# Patient Record
Sex: Male | Born: 1980 | Race: Black or African American | Hispanic: No | Marital: Single | Smoking: Former smoker
Health system: Southern US, Community
[De-identification: ages and names within clinical notes are randomized; demographics above are authoritative.]

## PROBLEM LIST (undated history)

## (undated) DIAGNOSIS — G4730 Sleep apnea, unspecified: Secondary | ICD-10-CM

## (undated) DIAGNOSIS — E669 Obesity, unspecified: Secondary | ICD-10-CM

## (undated) DIAGNOSIS — K219 Gastro-esophageal reflux disease without esophagitis: Secondary | ICD-10-CM

## (undated) DIAGNOSIS — E66811 Obesity, class 1: Secondary | ICD-10-CM

## (undated) HISTORY — PX: APPENDECTOMY: SHX54

---

## 2015-05-26 ENCOUNTER — Encounter: Payer: Self-pay | Admitting: *Deleted

## 2015-05-26 ENCOUNTER — Emergency Department
Admission: EM | Admit: 2015-05-26 | Discharge: 2015-05-26 | Disposition: A | Discharge status: Home / Self Care | Payer: Self-pay | Attending: Emergency Medicine | Admitting: Emergency Medicine

## 2015-05-26 DIAGNOSIS — Y9289 Other specified places as the place of occurrence of the external cause: Secondary | ICD-10-CM | POA: Insufficient documentation

## 2015-05-26 DIAGNOSIS — Y998 Other external cause status: Secondary | ICD-10-CM | POA: Insufficient documentation

## 2015-05-26 DIAGNOSIS — T161XXA Foreign body in right ear, initial encounter: Secondary | ICD-10-CM | POA: Insufficient documentation

## 2015-05-26 DIAGNOSIS — X58XXXA Exposure to other specified factors, initial encounter: Secondary | ICD-10-CM | POA: Insufficient documentation

## 2015-05-26 DIAGNOSIS — Z87891 Personal history of nicotine dependence: Secondary | ICD-10-CM | POA: Insufficient documentation

## 2015-05-26 DIAGNOSIS — Y9389 Activity, other specified: Secondary | ICD-10-CM | POA: Insufficient documentation

## 2015-05-26 NOTE — ED Provider Notes (Signed)
[facility] Provider Note  ____________________________________________  Time seen: 5:40 AM  I have reviewed the triage vital signs and the nursing notes.   HISTORY  Chief Complaint Foreign Body in Ear     HPI Vence Krantz is a 34 y.o. male presents with sensation of a bug in his right ear crawling around.    Past medical history Appendicitis There are no active problems to display for this patient.   Past Surgical History  Procedure Laterality Date  . Appendectomy      No current outpatient prescriptions on file.  Allergies Tylenol sinus congestion-pain  History reviewed. No pertinent family history.  Social History Social History  Substance Use Topics  . Smoking status: Former Games developer  . Smokeless tobacco: Never Used  . Alcohol Use: Yes     Comment: occasionally    Review of Systems  Constitutional: Negative for fever. Eyes: Negative for visual changes. ENT: Negative for sore throat. Foreign body in right ear " bug" Cardiovascular: Negative for chest pain. Respiratory: Negative for shortness of breath. Gastrointestinal: Negative for abdominal pain, vomiting and diarrhea. Genitourinary: Negative for dysuria. Musculoskeletal: Negative for back pain. Skin: Negative for rash. Neurological: Negative for headaches, focal weakness or numbness.   10-point ROS otherwise negative.  ____________________________________________   PHYSICAL EXAM:  VITAL SIGNS: ED Triage Vitals  Enc Vitals Group     BP 05/26/15 0527 137/97 mmHg     Pulse Rate 05/26/15 0527 80     Resp 05/26/15 0527 20     Temp 05/26/15 0527 98.1 F (36.7 C)     Temp Source 05/26/15 0527 Oral     SpO2 05/26/15 0527 93 %     Weight 05/26/15 0527 335 lb (151.955 kg)     Height 05/26/15 0527  (1.626 m)     Head Cir --      Peak Flow --      Pain Score --      Pain Loc --      Pain Edu? --      Excl. in GC? --       Constitutional: Alert and oriented. Well appearing and in no distress. Eyes: Conjunctivae are normal. PERRL. Normal extraocular movements. ENT   Head: Normocephalic and atraumatic.   Nose: No congestion/rhinnorhea.   Mouth/Throat: Mucous membranes are moist.   Neck: No stridor. Ears: Insect crawled out of the patient'right ear with direct visualization. TM visualized and intact.     INITIAL IMPRESSION / ASSESSMENT AND PLAN / ED COURSE  Pertinent labs & imaging results that were available during my care of the patient were reviewed by me and considered in my medical decision making (see chart for details).  In second patient's right ear removed without instrumentation.  ____________________________________________   FINAL CLINICAL IMPRESSION(S) / ED DIAGNOSES  Final diagnoses:  Foreign body in right ear, initial encounter      Darci Current, MD 05/26/15 [phone]

## 2015-05-26 NOTE — Discharge Instructions (Signed)
Ear Foreign Body °An ear foreign body is an object that is stuck in the ear. It is common for young children to put objects into the ear canal. These may include pebbles, beads, beans, and any other small objects which will fit. In adults, objects such as cotton swabs may become lodged in the ear canal. In all ages, the most common foreign bodies are insects that enter the ear canal.  °SYMPTOMS  °Foreign bodies may cause pain, buzzing or roaring sounds, hearing loss, and ear drainage.  °HOME CARE INSTRUCTIONS  °· Keep all follow-up appointments with your caregiver as told. °· Keep small objects out of reach of young children. Tell them not to put anything in their ears. °SEEK IMMEDIATE MEDICAL CARE IF:  °· You have bleeding from the ear. °· You have increased pain or swelling of the ear. °· You have reduced hearing. °· You have discharge coming from the ear. °· You have a fever. °· You have a headache. °MAKE SURE YOU:  °· Understand these instructions. °· Will watch your condition. °· Will get help right away if you are not doing well or get worse. °Document Released: 09/12/2000 Document Revised: 12/08/2011 Document Reviewed: 05/03/2008 °[employer]® Patient Information ©2015 [employer]. This information is not intended to replace advice given to you by your health care provider. Make sure you discuss any questions you have with your health care provider. ° °

## 2015-05-26 NOTE — ED Notes (Signed)
Pt states he felt something crawling on his head and he swiped it off with his hand then it went into his ear. Pt c/o insect bites on body, also.

## 2015-11-21 ENCOUNTER — Emergency Department (HOSPITAL_COMMUNITY)
Admission: EM | Admit: 2015-11-21 | Discharge: 2015-11-22 | Disposition: A | Discharge status: Home / Self Care | Payer: BLUE CROSS/BLUE SHIELD | Attending: Emergency Medicine | Admitting: Emergency Medicine

## 2015-11-21 ENCOUNTER — Encounter (HOSPITAL_COMMUNITY): Payer: Self-pay

## 2015-11-21 DIAGNOSIS — R51 Headache: Secondary | ICD-10-CM | POA: Diagnosis not present

## 2015-11-21 DIAGNOSIS — R300 Dysuria: Secondary | ICD-10-CM | POA: Insufficient documentation

## 2015-11-21 DIAGNOSIS — Z87891 Personal history of nicotine dependence: Secondary | ICD-10-CM | POA: Diagnosis not present

## 2015-11-21 DIAGNOSIS — R197 Diarrhea, unspecified: Secondary | ICD-10-CM | POA: Diagnosis present

## 2015-11-21 DIAGNOSIS — R112 Nausea with vomiting, unspecified: Secondary | ICD-10-CM | POA: Diagnosis not present

## 2015-11-21 DIAGNOSIS — R350 Frequency of micturition: Secondary | ICD-10-CM | POA: Diagnosis not present

## 2015-11-21 DIAGNOSIS — E663 Overweight: Secondary | ICD-10-CM | POA: Diagnosis not present

## 2015-11-21 DIAGNOSIS — R519 Headache, unspecified: Secondary | ICD-10-CM

## 2015-11-21 LAB — CBC
HCT: 47.7 % (ref 39.0–52.0)
HEMOGLOBIN: 15.4 g/dL (ref 13.0–17.0)
MCH: 26.4 pg (ref 26.0–34.0)
MCHC: 32.3 g/dL (ref 30.0–36.0)
MCV: 81.7 fL (ref 78.0–100.0)
Platelets: 171 10*3/uL (ref 150–400)
RBC: 5.84 MIL/uL — ABNORMAL HIGH (ref 4.22–5.81)
RDW: 13.9 % (ref 11.5–15.5)
WBC: 3.6 10*3/uL — AB (ref 4.0–10.5)

## 2015-11-21 LAB — I-STAT CHEM 8, ED
BUN: 8 mg/dL (ref 6–20)
CREATININE: 0.9 mg/dL (ref 0.61–1.24)
Calcium, Ion: 0.99 mmol/L — ABNORMAL LOW (ref 1.12–1.23)
Chloride: 102 mmol/L (ref 101–111)
Glucose, Bld: 102 mg/dL — ABNORMAL HIGH (ref 65–99)
HEMATOCRIT: 48 % (ref 39.0–52.0)
HEMOGLOBIN: 16.3 g/dL (ref 13.0–17.0)
Potassium: 3.9 mmol/L (ref 3.5–5.1)
Sodium: 137 mmol/L (ref 135–145)
TCO2: 21 mmol/L (ref 0–100)

## 2015-11-21 MED ORDER — METOCLOPRAMIDE HCL 5 MG/ML IJ SOLN
10.0000 mg | Freq: Once | INTRAMUSCULAR | Status: AC
  Administered 2015-11-21: 10 mg via INTRAVENOUS
  Filled 2015-11-21: qty 2

## 2015-11-21 MED ORDER — SODIUM CHLORIDE 0.9 % IV BOLUS (SEPSIS)
1000.0000 mL | Freq: Once | INTRAVENOUS | Status: AC
  Administered 2015-11-21: 1000 mL via INTRAVENOUS

## 2015-11-21 MED ORDER — DIPHENHYDRAMINE HCL 50 MG/ML IJ SOLN
12.5000 mg | Freq: Once | INTRAMUSCULAR | Status: AC
  Administered 2015-11-21: 12.5 mg via INTRAVENOUS
  Filled 2015-11-21: qty 1

## 2015-11-21 NOTE — ED Notes (Signed)
CIWA done on wrong patient, unable to edit in chart

## 2015-11-21 NOTE — ED Provider Notes (Signed)
CSN: [id number]     Arrival date & time 11/21/15  1927 History   First MD Initiated Contact with Patient 11/21/15 2249     Chief Complaint  Patient presents with  . Diarrhea  . Headache    HPI   Steve Hurst is a 35 y.o. male with a PMH of appendectomy who presents to the ED with headache, nausea, vomiting, and diarrhea. He states his symptoms started this morning. He reports constant headache, and notes he has a history of headaches and that his symptoms feel similar, though have persisted for longer period of time. He reports nausea and one episode of emesis today prior to arrival. He also notes diarrhea as well as mild dysuria and frequency. He reports subjective fever and chills. He denies vision changes, dizziness, lightheadedness, abdominal pain, hematemesis, hematochezia, melena.    History reviewed. No pertinent past medical history. Past Surgical History  Procedure Laterality Date  . Appendectomy     History reviewed. No pertinent family history. Social History  Substance Use Topics  . Smoking status: Former Games developer  . Smokeless tobacco: Never Used  . Alcohol Use: Yes     Comment: occasionally      Review of Systems  Constitutional: Positive for fever and chills.  Eyes: Negative for visual disturbance.  Gastrointestinal: Positive for nausea, vomiting and diarrhea. Negative for abdominal pain and constipation.  Genitourinary: Positive for dysuria and frequency. Negative for urgency and flank pain.  Neurological: Positive for headaches. Negative for dizziness, syncope, weakness, light-headedness and numbness.  All other systems reviewed and are negative.     Allergies  Tylenol sinus congestion-pain  Home Medications   Prior to Admission medications   Medication Sig Start Date End Date Taking? Authorizing Provider  ibuprofen (ADVIL,MOTRIN) 200 MG tablet Take 200 mg by mouth every 6 (six) hours as needed for moderate pain.   Yes Historical Provider, MD   cephALEXin (KEFLEX) 500 MG capsule Take 1 capsule (500 mg total) by mouth 4 (four) times daily. 11/22/15   Mady Gemma, PA-C  ondansetron (ZOFRAN ODT) 4 MG disintegrating tablet Take 1 tablet (4 mg total) by mouth every 8 (eight) hours as needed for nausea. 11/22/15   Mady Gemma, PA-C    BP 109/51 mmHg  Pulse 87  Temp(Src) 98.8 F (37.1 C) (Oral)  Resp 16  SpO2 97% Physical Exam  Constitutional: He is oriented to person, place, and time. No distress.  Overweight male in no acute distress.  HENT:  Head: Normocephalic and atraumatic.  Right Ear: External ear normal.  Left Ear: External ear normal.  Nose: Nose normal.  Mouth/Throat: Uvula is midline, oropharynx is clear and moist and mucous membranes are normal.  Eyes: Conjunctivae, EOM and lids are normal. Pupils are equal, round, and reactive to light. Right eye exhibits no discharge. Left eye exhibits no discharge. No scleral icterus.  Neck: Normal range of motion. Neck supple.  Cardiovascular: Normal rate, regular rhythm, normal heart sounds, intact distal pulses and normal pulses.   Pulmonary/Chest: Effort normal and breath sounds normal. No respiratory distress. He has no wheezes. He has no rales.  Abdominal: Soft. Normal appearance and bowel sounds are normal. He exhibits no distension and no mass. There is no tenderness. There is no rigidity, no rebound and no guarding.  No CVA tenderness.  Musculoskeletal: Normal range of motion. He exhibits no edema or tenderness.  Neurological: He is alert and oriented to person, place, and time. He has normal strength. No cranial nerve  deficit or sensory deficit.  Skin: Skin is warm, dry and intact. No rash noted. He is not diaphoretic. No erythema. No pallor.  Psychiatric: He has a normal mood and affect. His speech is normal and behavior is normal.  Nursing note and vitals reviewed.   ED Course  Procedures (including critical care time)  Labs Review Labs Reviewed   CBC - Abnormal; Notable for the following:    WBC 3.6 (*)    RBC 5.84 (*)    All other components within normal limits  COMPREHENSIVE METABOLIC PANEL - Abnormal; Notable for the following:    Glucose, Bld 100 (*)    Calcium 8.1 (*)    Total Protein 6.3 (*)    Albumin 3.3 (*)    Total Bilirubin 2.0 (*)    All other components within normal limits  URINALYSIS, ROUTINE W REFLEX MICROSCOPIC (NOT AT [facility]) - Abnormal; Notable for the following:    Color, Urine AMBER (*)    Specific Gravity, Urine 1.035 (*)    Bilirubin Urine SMALL (*)    Leukocytes, UA SMALL (*)    All other components within normal limits  URINE MICROSCOPIC-ADD ON - Abnormal; Notable for the following:    Squamous Epithelial / LPF 0-5 (*)    Bacteria, UA FEW (*)    All other components within normal limits  I-STAT CHEM 8, ED - Abnormal; Notable for the following:    Glucose, Bld 102 (*)    Calcium, Ion 0.99 (*)    All other components within normal limits  URINE CULTURE  LIPASE, BLOOD  I-STAT CG4 LACTIC ACID, ED    Imaging Review No results found.   I have personally reviewed and evaluated these lab results as part of my medical decision-making.   EKG Interpretation None      MDM   Final diagnoses:  Headache, unspecified headache type  Nausea, vomiting, and diarrhea  Dysuria    35 year old male presents with headache, nausea, vomiting, and diarrhea, which he states started this morning. Also notes mild dysuria and frequency.  Patient's temp initially 100.9 on arrival in the ED. HR 105. Patient's temp improved to 98.8. HR 102. Heart regular rhythm. Lungs clear to auscultation bilaterally. Abdomen soft, non-tender, non-distended. No rebound, guarding, or masses. No CVA tenderness. Normal neuro exam with no focal deficit. No nuchal rigidity.  Patient given fluids, benadryl, and reglan. On reassessment of patient, he reports significant symptom improvement and is resting comfortably. HR improved to  87.  CBC negative for leukocytosis or anemia. CMP remarkable for bilirubin 2. Lipase within normal limits. Lactic acid within normal limits. UA remarkable for small leukocytes, 6-30 WBC, few bacteria. Urine culture ordered. Given patient's complaint of dysuria and frequency, will treat with keflex.  Patient is non-toxic and well-appearing, feel he is stable for discharge at this time. Denies headache. No nausea or episodes of emesis or diarrhea in the ED. Patient to follow-up with PCP. Return precautions discussed. Patient verbalizes his understanding and is in agreement with plan.   BP 109/51 mmHg  Pulse 87  Temp(Src) 98.8 F (37.1 C) (Oral)  Resp 16  SpO2 97%        Mady Gemma, PA-C 11/22/15 0151  Tilden Fossa, MD 11/24/15 1444

## 2015-11-21 NOTE — ED Notes (Signed)
Pt complains of diarrhea and a severe headache since yesterday

## 2015-11-22 LAB — COMPREHENSIVE METABOLIC PANEL
ALT: 26 U/L (ref 17–63)
AST: 20 U/L (ref 15–41)
Albumin: 3.3 g/dL — ABNORMAL LOW (ref 3.5–5.0)
Alkaline Phosphatase: 56 U/L (ref 38–126)
Anion gap: 8 (ref 5–15)
BILIRUBIN TOTAL: 2 mg/dL — AB (ref 0.3–1.2)
BUN: 9 mg/dL (ref 6–20)
CHLORIDE: 105 mmol/L (ref 101–111)
CO2: 23 mmol/L (ref 22–32)
CREATININE: 0.95 mg/dL (ref 0.61–1.24)
Calcium: 8.1 mg/dL — ABNORMAL LOW (ref 8.9–10.3)
GFR calc Af Amer: >60 mL/min (ref >60)
Glucose, Bld: 100 mg/dL — ABNORMAL HIGH (ref 65–99)
Potassium: 3.7 mmol/L (ref 3.5–5.1)
Sodium: 136 mmol/L (ref 135–145)
Total Protein: 6.3 g/dL — ABNORMAL LOW (ref 6.5–8.1)

## 2015-11-22 LAB — URINE MICROSCOPIC-ADD ON

## 2015-11-22 LAB — I-STAT CG4 LACTIC ACID, ED: Lactic Acid, Venous: 0.63 mmol/L (ref 0.5–2.0)

## 2015-11-22 LAB — URINALYSIS, ROUTINE W REFLEX MICROSCOPIC
GLUCOSE, UA: NEGATIVE mg/dL
HGB URINE DIPSTICK: NEGATIVE
KETONES UR: NEGATIVE mg/dL
Nitrite: NEGATIVE
PH: 5.5 (ref 5.0–8.0)
Protein, ur: NEGATIVE mg/dL
Specific Gravity, Urine: 1.035 — ABNORMAL HIGH (ref 1.005–1.030)

## 2015-11-22 LAB — LIPASE, BLOOD: LIPASE: 24 U/L (ref 11–51)

## 2015-11-22 MED ORDER — ONDANSETRON 4 MG PO TBDP: 4.0000 mg | ORAL_TABLET | Freq: Three times a day (TID) | ORAL | Status: DC | PRN

## 2015-11-22 MED ORDER — CEPHALEXIN 500 MG PO CAPS: 500.0000 mg | ORAL_CAPSULE | Freq: Four times a day (QID) | ORAL | Status: DC

## 2015-11-22 NOTE — Discharge Instructions (Signed)
1. Medications: zofran for nausea, keflex for urine infection, usual home medications 2. Treatment: rest, drink plenty of fluids 3. Follow Up: please followup with your primary doctor for discussion of your diagnoses and further evaluation after today's visit; if you do not have a primary care doctor use the resource guide provided to find one; please return to the ER for high fever, severe headache, persistent vomiting, new or worsening symptoms    General Headache Without Cause A headache is pain or discomfort felt around the head or neck area. There are many causes and types of headaches. In some cases, the cause may not be found.  HOME CARE  Managing Pain  Take over-the-counter and prescription medicines only as told by your doctor.  Lie down in a dark, quiet room when you have a headache.  If directed, apply ice to the head and neck area:  Put ice in a plastic bag.  Place a towel between your skin and the bag.  Leave the ice on for 20 minutes, 2-3 times per day.  Use a heating pad or hot shower to apply heat to the head and neck area as told by your doctor.  Keep lights dim if bright lights bother you or make your headaches worse. Eating and Drinking  Eat meals on a regular schedule.  Lessen how much alcohol you drink.  Lessen how much caffeine you drink, or stop drinking caffeine. General Instructions  Keep all follow-up visits as told by your doctor. This is important.  Keep a journal to find out if certain things bring on headaches. For example, write down:  What you eat and drink.  How much sleep you get.  Any change to your diet or medicines.  Relax by getting a massage or doing other relaxing activities.  Lessen stress.  Sit up straight. Do not tighten (tense) your muscles.  Do not use tobacco products. This includes cigarettes, chewing tobacco, or e-cigarettes. If you need help quitting, ask your doctor.  Exercise regularly as told by your  doctor.  Get enough sleep. This often means 7-9 hours of sleep. GET HELP IF:  Your symptoms are not helped by medicine.  You have a headache that feels different than the other headaches.  You feel sick to your stomach (nauseous) or you throw up (vomit).  You have a fever. GET HELP RIGHT AWAY IF:   Your headache becomes really bad.  You keep throwing up.  You have a stiff neck.  You have trouble seeing.  You have trouble speaking.  You have pain in the eye or ear.  Your muscles are weak or you lose muscle control.  You lose your balance or have trouble walking.  You feel like you will pass out (faint) or you pass out.  You have confusion.   This information is not intended to replace advice given to you by your health care provider. Make sure you discuss any questions you have with your health care provider.   Document Released: 06/24/2008 Document Revised: 06/06/2015 Document Reviewed: 01/08/2015 Elsevier Interactive Patient Education 2016 Elsevier Inc.  Nausea and Vomiting Nausea means you feel sick to your stomach. Throwing up (vomiting) is a reflex where stomach contents come out of your mouth. HOME CARE   Take medicine as told by your doctor.  Do not force yourself to eat. However, you do need to drink fluids.  If you feel like eating, eat a normal diet as told by your doctor.  Eat rice, wheat, potatoes,  bread, lean meats, yogurt, fruits, and vegetables.  Avoid high-fat foods.  Drink enough fluids to keep your pee (urine) clear or pale yellow.  Ask your doctor how to replace body fluid losses (rehydrate). Signs of body fluid loss (dehydration) include:  Feeling very thirsty.  Dry lips and mouth.  Feeling dizzy.  Dark pee.  Peeing less than normal.  Feeling confused.  Fast breathing or heart rate. GET HELP RIGHT AWAY IF:   You have blood in your throw up.  You have black or bloody poop (stool).  You have a bad headache or stiff  neck.  You feel confused.  You have bad belly (abdominal) pain.  You have chest pain or trouble breathing.  You do not pee at least once every 8 hours.  You have cold, clammy skin.  You keep throwing up after 24 to 48 hours.  You have a fever. MAKE SURE YOU:   Understand these instructions.  Will watch your condition.  Will get help right away if you are not doing well or get worse.   This information is not intended to replace advice given to you by your health care provider. Make sure you discuss any questions you have with your health care provider.   Document Released: 03/03/2008 Document Revised: 12/08/2011 Document Reviewed: 02/14/2011 Elsevier Interactive Patient Education 2016 [employer].   Emergency Department Resource Guide 1) Find a Doctor and Pay Out of Pocket Although you won't have to find out who is covered by your insurance plan, it is a good idea to ask around and get recommendations. You will then need to call the office and see if the doctor you have chosen will accept you as a new patient and what types of options they offer for patients who are self-pay. Some doctors offer discounts or will set up payment plans for their patients who do not have insurance, but you will need to ask so you aren't surprised when you get to your appointment.  2) Contact Your Local Health Department Not all health departments have doctors that can see patients for sick visits, but many do, so it is worth a call to see if yours does. If you don't know where your local health department is, you can check in your phone book. The CDC also has a tool to help you locate your state's health department, and many state websites also have listings of all of their local health departments.  3) Find a Walk-in Clinic If your illness is not likely to be very severe or complicated, you may want to try a walk in clinic. These are popping up all over the country in pharmacies, drugstores, and  shopping centers. They're usually staffed by nurse practitioners or physician assistants that have been trained to treat common illnesses and complaints. They're usually fairly quick and inexpensive. However, if you have serious medical issues or chronic medical problems, these are probably not your best option.  No Primary Care Doctor: - Call Health Connect at  [phone] - they can help you locate a primary care doctor that  accepts your insurance, provides certain services, etc. - Physician Referral Service- [phone]  Chronic Pain Problems: Organization         Address  Phone   Notes  [facility]  [phone] Patients need to be referred by their primary care doctor.   Medication Assistance: Organization         Address  Phone   Notes  [facility]  Medication Assistance Program [address] [place], [place] [address] [phone] --Must be a resident of [facility] -- Must have NO insurance coverage whatsoever (no Medicaid/ Medicare, etc.) -- The pt. MUST have a primary care doctor that directs their care regularly and follows them in the community   [facility]  [phone]   [employer]  [phone]    Agencies that provide inexpensive medical care: Organization         Address  Phone   Notes  [facility]  [phone]   [facility]    [phone]   [facility] [address] [place], [place] [address] [phone]   [facility] [address] [place] [place] [phone]   Planned Parenthood    [phone]   [facility]    [phone]   Community Health and [facility]  [address], [place] Phone:  [phone], Fax:  [phone] Hours of Operation:  9 am - 6 pm, M-F.  Also accepts Medicaid/Medicare and self-pay.  [facility]  [address], [place] Phone: [phone],  Fax: [phone]. Hours of Operation:  8:30 am - 5:30 pm, M-F.  Also accepts Medicaid and self-pay.  [facility] [place] [address], [place] Phone: [phone]   [facility] [address] Natasha Bence [place], [place] [phone], Ext. [phone] Mondays & Thursdays: 7-9 AM.  First 15 patients are seen on a first come, first serve basis.    Medicaid-accepting [facility] Providers:  Organization         Address  Phone   Notes  [facility] [address], [place] [phone] Also accepts self-pay patients.  [facility] [address] Laurell Josephs [employer], [place]  [phone]   [facility] [address], [place] [phone]   [facility] [address] [place] [phone]   Renaye Rakers [address], [place]   [phone] Only accepts [place] Access [place] patients after they have their name applied to their card.   Self-Pay (no insurance) in [facility]:  Organization         Address  Phone   Notes  Sickle Cell Patients, [facility] Internal Medicine [address] [place], [place] [phone]   [facility] [address] [place], [place] [phone]   [facility] [place]  [address], [place] [phone]   [facility]/Dr. Osei-Bonsu  [address], [place] or [address], [place] [phone] Phone number for both [place] and [place] locations is the same.  Urgent Medical and [facility] [address] [place] [phone]   [facility] [address] [place] or [address] [phone]   [facility] [address] [place] [phone], phone; [phone], fax Sees patients 1st and 3rd Saturday of every month.  Must not qualify for public or private  insurance (i.e. Medicaid, Medicare, [place] Health Choice, Veterans' Benefits)  Household income should be no more than 200% of the poverty level The clinic cannot treat you if you are pregnant or think you are pregnant  Sexually transmitted diseases are not treated  at the clinic.    Dental Care: Organization         Address  Phone  Notes  [facility] Department of [facility] [address] [place], [place] [phone] Accepts children up to age 64 who are enrolled in [place] or [place] Health Choice; pregnant women with a Medicaid card; and children who have applied for Medicaid or [place] Health Choice, but were declined, whose parents can pay a reduced fee at time of service.  [facility] Department of [facility]  [address], [place] [phone] Accepts children up to age 43 who are enrolled in [place] or [place] Health Choice; pregnant women with a Medicaid card; and children who have applied for Medicaid or [place] Health Choice, but were declined, whose parents can pay a reduced fee at time of service.  [facility]  [address] [place], [place] [phone] Patients are seen by appointment only. Walk-ins are not accepted. [facility] will see patients 38 years of age and older. Monday - Tuesday (8am-5pm) Most Wednesdays (8:30-5pm) $30 per visit, cash only  [facility] Adult [facility] Access [facility]  [address], [facility] [phone] Patients are seen by appointment only. Walk-ins are not accepted. [facility] will see patients 54 years of age and older. One Wednesday Evening (Monthly: Volunteer Based).  $30 per visit, cash only  [employer]  [phone] for adults; Children under age 64, call Graduate Pediatric Dentistry at [phone]. Children aged 2-14, please call [phone] to request a pediatric application.  Dental services are provided in all areas of dental care  including fillings, crowns and bridges, complete and partial dentures, implants, gum treatment, root canals, and extractions. Preventive care is also provided. Treatment is provided to both adults and children. Patients are selected via a lottery and there is often a waiting list.   [facility] [address], [place]  [phone] [email or web address]   [facility] [address] [place], [place] [phone], Ext. [phone] Second and Fourth Thursday of each month, opens at 6:30 AM; Clinic ends at 9 AM.  Patients are seen on a first-come first-served basis, and a limited number are seen during each clinic.   [facility]  [address] Ether [place]ins [place], [place] [phone]   Eligibility Requirements You must have lived in [place], [place], or [place] counties for at least the last three months.   You cannot be eligible for state or federal sponsored [place], including [employer], [place], or [employer].   You generally cannot be eligible for healthcare insurance through your employer.    How to apply: Eligibility screenings are held every Tuesday and Wednesday afternoon from 1:00 pm until 4:00 pm. You do not need an appointment for the interview!  [facility] [address], [place], [place] [phone]   [facility] Health Department  [phone]   [facility] Health Department  [phone]   [facility] Health Department  [phone]    Behavioral Health Resources in the Community: Intensive Outpatient Programs Organization         Address  Phone  Notes  [facility] [address], [place], [place] [phone]   [facility] Outpatient [address], [place], [place] [phone]   [facility] [address], [place], [place]  [phone]   [facility]  Health [address],  [place], [place] [phone] or [phone]    Substance Abuse Resources Organization         Address  Phone  Notes  [facility]  [phone]   [facility]  [phone]   [facility] [place]  [phone]   Floydene Flock  [phone]   Residential & Outpatient Substance Abuse Program  [phone]   Psychological Services Organization         Address  Phone  Notes  [facility]  [phone]   [facility] Services  [phone]   [facility] Mental Health [address] [place] [phone] or [phone]    Mobile Crisis Teams Organization         Address  Phone  Notes  [facility], Mobile Crisis Care Unit  [phone]   [facility]  [address] [place], [place] [phone]   Doristine Locks [address] [place] [phone]    Self-Help/Support Groups Organization         Address  Phone             Notes  [facility] [place] - variety of support groups  [phone]- [phone] Call for more information  Narcotics Anonymous (NA), [facility] [address], [employer] [place]  2 meetings at this location   Statistician         Address  Phone  Notes  [facility] [address],    [place]  [phone]   [facility]  [address] [place] [address], [place], [place] [phone]   [facility] Treatment [facility] [address] [place], [place] [phone] Admissions: 8am-3pm M-F  [facility] [address]    [place], [place] [phone]   [facility] [address] [place], [place], [place] [phone]   [facility] [address],  [place], [place] [phone]   [facility] - Intensive Outpatient [address] Laurell Josephs [address], [place], [place] [phone]   [facility] ([facility] [address] [place]  [place], [place] [phone] or [phone]   [facility]  Services ([facility]) [address], [place], [place] [phone] Accepts Medicaid  [facility] [address],  [place] [phone] Substance Abuse/Addiction Treatment   [facility] Organization         Address  Phone  Notes  [facility]  [phone]   Angie Fava, PhD [address] Ervin Knack [place], [place]   [phone] or [phone]   [facility]al   [address] [place], [place] [phone]   [facility] [address], [place], [place] [phone] Insurance/Medicaid/sponsorship through [facility] and Families [address]                                    [place], [place] [phone] Therapy/tele-psych/case  [facility] [address], [place] [phone]    Dr. Lolly Mustache  [phone]   [facility]  [employer] [facility] Dept. 1) [address], [place] 2) [address], [place] 3)  [address], [place] [phone]  [phone]   [facility] Child Abuse Hotline [phone] or [phone] (After Hours)

## 2015-11-23 LAB — URINE CULTURE: CULTURE: NO GROWTH

## 2016-06-06 ENCOUNTER — Encounter (HOSPITAL_COMMUNITY): Payer: Self-pay | Admitting: Emergency Medicine

## 2016-06-06 ENCOUNTER — Emergency Department (HOSPITAL_COMMUNITY)
Admission: EM | Admit: 2016-06-06 | Discharge: 2016-06-06 | Disposition: A | Discharge status: Home / Self Care | Payer: BLUE CROSS/BLUE SHIELD | Attending: Emergency Medicine | Admitting: Emergency Medicine

## 2016-06-06 DIAGNOSIS — H539 Unspecified visual disturbance: Secondary | ICD-10-CM | POA: Diagnosis not present

## 2016-06-06 DIAGNOSIS — Z87891 Personal history of nicotine dependence: Secondary | ICD-10-CM | POA: Insufficient documentation

## 2016-06-06 DIAGNOSIS — H9319 Tinnitus, unspecified ear: Secondary | ICD-10-CM | POA: Insufficient documentation

## 2016-06-06 DIAGNOSIS — R51 Headache: Secondary | ICD-10-CM | POA: Diagnosis present

## 2016-06-06 DIAGNOSIS — R519 Headache, unspecified: Secondary | ICD-10-CM

## 2016-06-06 MED ORDER — METOCLOPRAMIDE HCL 5 MG/ML IJ SOLN
10.0000 mg | INTRAMUSCULAR | Status: AC
  Administered 2016-06-06: 10 mg via INTRAVENOUS
  Filled 2016-06-06: qty 2

## 2016-06-06 MED ORDER — DIPHENHYDRAMINE HCL 50 MG/ML IJ SOLN
12.5000 mg | Freq: Once | INTRAMUSCULAR | Status: AC
  Administered 2016-06-06: 12.5 mg via INTRAVENOUS
  Filled 2016-06-06: qty 1

## 2016-06-06 MED ORDER — DEXAMETHASONE SODIUM PHOSPHATE 10 MG/ML IJ SOLN
10.0000 mg | Freq: Once | INTRAMUSCULAR | Status: AC
  Administered 2016-06-06: 10 mg via INTRAVENOUS
  Filled 2016-06-06: qty 1

## 2016-06-06 NOTE — ED Triage Notes (Signed)
Pt reports headache to bilateral temporal regions onset Monday.

## 2016-06-06 NOTE — ED Provider Notes (Signed)
[facility]-EMERGENCY DEPT Provider Note   CSN: [id number] Arrival date & time: 06/06/16  1455  By signing my name below, I, Rosario Adie, attest that this documentation has been prepared under the direction and in the presence of [employer], PA-C.  Electronically Signed: Rosario Adie, ED Scribe. 06/06/16. 9:44 PM.  History   Chief Complaint Chief Complaint  Patient presents with  . Headache   The history is provided by the patient and medical records. No language interpreter was used.    HPI Comments: Steve Hurst is a 36 y.o. male with no pertinent PMHx, who presents to the Emergency Department complaining of gradual onset, stabbing bilateral temporal headache onset 4 days ago. He notes that his HA moves around his head, going from both of his temporal areas, behind his eyes, and between his eyebrows. Pt reports associated intermittent right eye blurriness yesterday and intermittent tinnitus. Pt states that 2 days ago he took a dose of Tylenol with minimal relief of his pain. No other treatments were tried prior to coming into the ED. No recent head injury. He has been seen in the ED for same in the past, but notes that his HA today is worse. Denies fever, photophobia, phonophobia, nausea, emesis, unilateral weakness, numbness, gait difficulties, or any other associated symptoms.    History reviewed. No pertinent past medical history.  There are no active problems to display for this patient.  Past Surgical History:  Procedure Laterality Date  . APPENDECTOMY      Home Medications    Prior to Admission medications   Medication Sig Start Date End Date Taking? Authorizing Provider  cephALEXin (KEFLEX) 500 MG capsule Take 1 capsule (500 mg total) by mouth 4 (four) times daily. Patient not taking: Reported on 06/06/2016 11/22/15   Mady Gemma, PA-C  ondansetron (ZOFRAN ODT) 4 MG disintegrating tablet Take 1 tablet (4 mg total) by mouth every 8 (eight) hours as needed  for nausea. Patient not taking: Reported on 06/06/2016 11/22/15   Mady Gemma, PA-C   Family History No family history on file.  Social History Social History  Substance Use Topics  . Smoking status: Former Games developer  . Smokeless tobacco: Never Used  . Alcohol use Yes     Comment: occasionally   Allergies   Tylenol sinus congestion-pain [phenylephrine-acetaminophen]  Review of Systems Review of Systems  Constitutional: Negative for fever.  HENT: Positive for tinnitus.        Negative for phonophobia.  Eyes: Positive for visual disturbance. Negative for photophobia.  Gastrointestinal: Negative for nausea and vomiting.  Musculoskeletal: Negative for gait problem.  Neurological: Positive for headaches. Negative for weakness and numbness.  A complete 10 system review of systems was obtained and all systems are negative except as noted in the HPI and PMH.    Physical Exam Updated Vital Signs BP 118/63   Pulse (!) 53   Temp 97.6 F (36.4 C) (Oral)   Resp 17   Ht 5\' 4"  (1.626 m)   Wt (!) 155.1 kg   SpO2 96%   BMI 58.70 kg/m   Physical Exam  Constitutional: He is oriented to person, place, and time. He appears well-developed and well-nourished. No distress.  Nontoxic-appearing  HENT:  Head: Normocephalic and atraumatic.  Mouth/Throat: Oropharynx is clear and moist.  Symmetric rise of the uvula with phonation  Eyes: Conjunctivae and EOM are normal. Pupils are equal, round, and reactive to light. No scleral icterus.  EOMs normal without nystagmus  Neck:  Normal range of motion.  No nuchal rigidity or meningismus  Cardiovascular: Normal rate, regular rhythm and intact distal pulses.   Pulmonary/Chest: Effort normal. No respiratory distress.  Respirations even and unlabored  Musculoskeletal: Normal range of motion.  Neurological: He is alert and oriented to person, place, and time. He displays normal reflexes. No cranial nerve deficit. Coordination normal.  GCS 15.  Speech is goal oriented. No cranial nerve deficits appreciated; symmetric eyebrow raise, no facial drooping, tongue midline. Patient has equal grip strength bilaterally with 5/5 strength against resistance in all major muscle groups bilaterally. Sensation to light touch intact. She moves extremities without ataxia.  Skin: Skin is warm and dry. No rash noted. He is not diaphoretic. No erythema. No pallor.  Psychiatric: He has a normal mood and affect. His behavior is normal.  Nursing note and vitals reviewed.   ED Treatments / Results  DIAGNOSTIC STUDIES: Oxygen Saturation is 96% on RA, normal by my interpretation.   COORDINATION OF CARE: 9:44 PM-Discussed next steps with pt. Pt verbalized understanding and is agreeable with the plan.   Labs (all labs ordered are listed, but only abnormal results are displayed) Labs Reviewed - No data to display  EKG  EKG Interpretation None      Radiology No results found.  Procedures Procedures (including critical care time)  Medications Ordered in ED Medications  metoCLOPramide (REGLAN) injection 10 mg (10 mg Intravenous Given 06/06/16 2208)  diphenhydrAMINE (BENADRYL) injection 12.5 mg (12.5 mg Intravenous Given 06/06/16 2207)  dexamethasone (DECADRON) injection 10 mg (10 mg Intravenous Given 06/06/16 2206)    Initial Impression / Assessment and Plan / ED Course  I have reviewed the triage vital signs and the nursing notes.  Pertinent labs & imaging results that were available during my care of the patient were reviewed by me and considered in my medical decision making (see chart for details).  Clinical Course    35 year old male presents to the emergency department for evaluation of headache. He has a nonfocal neurologic exam. No history of head trauma. Patient was seen earlier this ear for similar headache symptoms. He reports his pain today is worse. Patient is afebrile. He has no nuchal rigidity or meningismus to suggest meningitis.  His symptoms resolved with a migraine cocktail. I do not believe further emergent workup or imaging is indicated at this time. Patient discharged with instructions for supportive care. He is agreeable to plan with no unaddressed concerns; discharged in stable condition.   Final Clinical Impressions(s) / ED Diagnoses   Final diagnoses:  Bad headache    New Prescriptions New Prescriptions   No medications on file    I personally performed the services described in this documentation, which was scribed in my presence. The recorded information has been reviewed and is accurate.       Antony MaduraKelly Jontae Sonier, PA-C 06/06/16 2317    Lavera Guiseana Duo Liu, MD 06/07/16 [phone]

## 2016-06-22 ENCOUNTER — Emergency Department (HOSPITAL_COMMUNITY)
Admission: EM | Admit: 2016-06-22 | Discharge: 2016-06-22 | Disposition: A | Discharge status: Home / Self Care | Payer: BLUE CROSS/BLUE SHIELD | Attending: Emergency Medicine | Admitting: Emergency Medicine

## 2016-06-22 ENCOUNTER — Emergency Department (HOSPITAL_COMMUNITY): Payer: BLUE CROSS/BLUE SHIELD

## 2016-06-22 ENCOUNTER — Encounter (HOSPITAL_COMMUNITY): Payer: Self-pay | Admitting: *Deleted

## 2016-06-22 DIAGNOSIS — Y999 Unspecified external cause status: Secondary | ICD-10-CM | POA: Insufficient documentation

## 2016-06-22 DIAGNOSIS — Z87891 Personal history of nicotine dependence: Secondary | ICD-10-CM | POA: Diagnosis not present

## 2016-06-22 DIAGNOSIS — Y939 Activity, unspecified: Secondary | ICD-10-CM | POA: Insufficient documentation

## 2016-06-22 DIAGNOSIS — Y929 Unspecified place or not applicable: Secondary | ICD-10-CM | POA: Diagnosis not present

## 2016-06-22 DIAGNOSIS — X509XXA Other and unspecified overexertion or strenuous movements or postures, initial encounter: Secondary | ICD-10-CM | POA: Insufficient documentation

## 2016-06-22 DIAGNOSIS — M25562 Pain in left knee: Secondary | ICD-10-CM | POA: Insufficient documentation

## 2016-06-22 HISTORY — DX: Sleep apnea, unspecified: G47.30

## 2016-06-22 MED ORDER — MELOXICAM 15 MG PO TABS: 15.0000 mg | ORAL_TABLET | Freq: Every day | ORAL | 0 refills | Status: DC

## 2016-06-22 NOTE — Discharge Instructions (Signed)
Ice and elevate your knee.avoid strenuous activity. Start performing knee exercises. Mobic for pain and inflammation. Follow up with primary care doctor or orthopedics.

## 2016-06-22 NOTE — ED Notes (Signed)
Transported to xray 

## 2016-06-22 NOTE — ED Notes (Signed)
Applied by Karrie DoffingA Johnson, NT.

## 2016-06-22 NOTE — ED Provider Notes (Signed)
[facility]EMERGENCY DEPT Provider Note   CSN: [id number] Arrival date & time: 06/22/16  1313  By signing my name below, I, [name], attest that this documentation has been prepared under the direction and in the presence of [name], PA-C.  Electronically Signed: Octavia HeirArianna Nassar, ED Scribe. 06/22/16. 3:12 PM.    History   Chief Complaint Chief Complaint  Patient presents with  . Knee Pain   The history is provided by the patient. No language interpreter was used.   HPI Comments: Steve Hurst is a 35 y.o. male who presents to the Emergency Department complaining of sudden onset, gradual worsening, moderate left knee pain x 2 days. He reports injuring his knee yesterday after stepping wrong and heard a loud "pop". Pt reports increased pain when going from sitting to standing.  He is able to ambulate. Pt also notes hearing a "clicking" sound whenever he stands up. Pt says he has a hx of injuring the same knee in the past. He denies any other complaints or symptoms.  Past Medical History:  Diagnosis Date  . Obesity   . Sleep apnea     There are no active problems to display for this patient.   Past Surgical History:  Procedure Laterality Date  . APPENDECTOMY         Home Medications    Prior to Admission medications   Medication Sig Start Date End Date Taking? Authorizing Provider  cephALEXin (KEFLEX) 500 MG capsule Take 1 capsule (500 mg total) by mouth 4 (four) times daily. Patient not taking: Reported on 06/06/2016 11/22/15   [name], PA-C  ondansetron (ZOFRAN ODT) 4 MG disintegrating tablet Take 1 tablet (4 mg total) by mouth every 8 (eight) hours as needed for nausea. Patient not taking: Reported on 06/06/2016 11/22/15   [name], PA-C    Family History History reviewed. No pertinent family history.  Social History Social History  Substance Use Topics  . Smoking status: Former Games developermoker  . Smokeless tobacco: Never Used  . Alcohol  use Yes     Comment: occasionally     Allergies   Tylenol sinus congestion-pain [phenylephrine-acetaminophen]   Review of Systems Review of Systems  Musculoskeletal: Positive for arthralgias. Negative for joint swelling.  Neurological: Negative for weakness and numbness.     Physical Exam Updated Vital Signs BP 141/92 (BP Location: Right Arm)   Pulse 80   Temp 98.2 F (36.8 C) (Oral)   Resp 14   SpO2 100%   Physical Exam  Constitutional: He appears well-developed and well-nourished. No distress.  Eyes: Conjunctivae are normal.  Neck: Neck supple.  Cardiovascular: Normal rate.   Pulmonary/Chest: No respiratory distress.  Abdominal: He exhibits no distension.  Musculoskeletal:  No obvious swelling, deformity noted to the left knee. Full range of motion of the knee. Tenderness to palpation in the lateral joint. Negative anterior posterior drawer signs. No laxity with medial or lateral stress. Normal ankle. Dorsal pedal pulse intact. No calf tenderness. No lower extremity swelling. Patellar tendon intact.  Skin: Skin is warm and dry.  Nursing note and vitals reviewed.    ED Treatments / Results  DIAGNOSTIC STUDIES: Oxygen Saturation is 100% on RA, normal by my interpretation.  COORDINATION OF CARE:  3:11 PM Discussed treatment plan with pt at bedside and pt agreed to plan.  Labs (all labs ordered are listed, but only abnormal results are displayed) Labs Reviewed - No data to display  EKG  EKG Interpretation None  Radiology Dg Knee Complete 4 Views Left  Result Date: 06/22/2016 CLINICAL DATA:  Lateral knee pain EXAM: LEFT KNEE - COMPLETE 4+ VIEW COMPARISON:  None. FINDINGS: No fracture or dislocation is seen. Mild degenerative changes with sharpening of the tibial spines. Visualized soft tissues are within normal limits. No suprapatellar knee joint effusion. IMPRESSION: No fracture or dislocation is seen. Electronically Signed   By: Charline Bills M.D.    On: 06/22/2016 16:39    Procedures Procedures (including critical care time)  Medications Ordered in ED Medications - No data to display   Initial Impression / Assessment and Plan / ED Course  I have reviewed the triage vital signs and the nursing notes.  Pertinent labs & imaging results that were available during my care of the patient were reviewed by me and considered in my medical decision making (see chart for details).  Clinical Course    Patient emergency department with left knee pain, worse with standing up, feels popping and locking sensation. X-ray obtained. Patient is ambulatory. No evidence of infection or joint effusion. Question sprain vs meniscal injury. hom with pcp or ortho follow up. tx with nsaids.  Vitals:   06/22/16 1342 06/22/16 1653  BP: 141/92 138/81  Pulse: 80 70  Resp: 14 19  Temp: 98.2 F (36.8 C)   TempSrc: Oral   SpO2: 100% 99%     Final Clinical Impressions(s) / ED Diagnoses   Final diagnoses:  Left knee pain    New Prescriptions Discharge Medication List as of 06/22/2016  4:48 PM    START taking these medications   Details  meloxicam (MOBIC) 15 MG tablet Take 1 tablet (15 mg total) by mouth daily., Starting Sun 06/22/2016, Print         [name], PA-C 06/23/16 1610    Glynn Octave, MD 06/23/16 1729

## 2016-06-22 NOTE — ED Triage Notes (Signed)
Pt reports left knee pain since yesterday, hx of injury in past. No acute distress noted and ambulatory at triage.

## 2016-06-22 NOTE — ED Notes (Signed)
Pt asking for po fluids - requested for pt to wait until PA assesses him. Voiced understanding.

## 2016-07-15 ENCOUNTER — Emergency Department (HOSPITAL_COMMUNITY)
Admission: EM | Admit: 2016-07-15 | Discharge: 2016-07-15 | Disposition: A | Discharge status: Home / Self Care | Payer: BLUE CROSS/BLUE SHIELD | Attending: Emergency Medicine | Admitting: Emergency Medicine

## 2016-07-15 ENCOUNTER — Encounter (HOSPITAL_COMMUNITY): Payer: Self-pay | Admitting: *Deleted

## 2016-07-15 DIAGNOSIS — Z5321 Procedure and treatment not carried out due to patient leaving prior to being seen by health care provider: Secondary | ICD-10-CM | POA: Insufficient documentation

## 2016-07-15 DIAGNOSIS — R109 Unspecified abdominal pain: Secondary | ICD-10-CM | POA: Diagnosis present

## 2016-07-15 DIAGNOSIS — Z87891 Personal history of nicotine dependence: Secondary | ICD-10-CM | POA: Insufficient documentation

## 2016-07-15 LAB — URINALYSIS, ROUTINE W REFLEX MICROSCOPIC
BILIRUBIN URINE: NEGATIVE
Glucose, UA: NEGATIVE mg/dL
HGB URINE DIPSTICK: NEGATIVE
KETONES UR: NEGATIVE mg/dL
NITRITE: NEGATIVE
PH: 5.5 (ref 5.0–8.0)
Protein, ur: NEGATIVE mg/dL
SPECIFIC GRAVITY, URINE: 1.02 (ref 1.005–1.030)

## 2016-07-15 LAB — CBC WITH DIFFERENTIAL/PLATELET
BASOS ABS: 0 10*3/uL (ref 0.0–0.1)
Basophils Relative: 1 %
EOS PCT: 3 %
Eosinophils Absolute: 0.1 10*3/uL (ref 0.0–0.7)
HEMATOCRIT: 44.3 % (ref 39.0–52.0)
HEMOGLOBIN: 14.4 g/dL (ref 13.0–17.0)
LYMPHS ABS: 1.7 10*3/uL (ref 0.7–4.0)
LYMPHS PCT: 39 %
MCH: 25.9 pg — AB (ref 26.0–34.0)
MCHC: 32.5 g/dL (ref 30.0–36.0)
MCV: 79.8 fL (ref 78.0–100.0)
Monocytes Absolute: 0.3 10*3/uL (ref 0.1–1.0)
Monocytes Relative: 6 %
NEUTROS ABS: 2.2 10*3/uL (ref 1.7–7.7)
NEUTROS PCT: 51 %
Platelets: 189 10*3/uL (ref 150–400)
RBC: 5.55 MIL/uL (ref 4.22–5.81)
RDW: 14.3 % (ref 11.5–15.5)
WBC: 4.4 10*3/uL (ref 4.0–10.5)

## 2016-07-15 LAB — COMPREHENSIVE METABOLIC PANEL
ALK PHOS: 60 U/L (ref 38–126)
ALT: 34 U/L (ref 17–63)
AST: 31 U/L (ref 15–41)
Albumin: 3.7 g/dL (ref 3.5–5.0)
Anion gap: 7 (ref 5–15)
BUN: 8 mg/dL (ref 6–20)
CALCIUM: 9.1 mg/dL (ref 8.9–10.3)
CO2: 24 mmol/L (ref 22–32)
CREATININE: 1.02 mg/dL (ref 0.61–1.24)
Chloride: 107 mmol/L (ref 101–111)
Glucose, Bld: 94 mg/dL (ref 65–99)
Potassium: 3.7 mmol/L (ref 3.5–5.1)
Sodium: 138 mmol/L (ref 135–145)
Total Bilirubin: 0.7 mg/dL (ref 0.3–1.2)
Total Protein: 6.9 g/dL (ref 6.5–8.1)

## 2016-07-15 LAB — URINE MICROSCOPIC-ADD ON
Bacteria, UA: NONE SEEN
RBC / HPF: NONE SEEN RBC/hpf (ref 0–5)

## 2016-07-15 NOTE — ED Notes (Signed)
Pt came to nurse first and advised he is leaving, doesn't want to wait any longer.  Pt encouraged to stay.

## 2016-07-15 NOTE — ED Triage Notes (Signed)
Patient presents stating he has right flank pain that radiates to the right groin /testicle area  Denies urinary symptoms

## 2016-07-21 ENCOUNTER — Emergency Department (HOSPITAL_COMMUNITY)
Admission: EM | Admit: 2016-07-21 | Discharge: 2016-07-21 | Disposition: A | Discharge status: Home / Self Care | Payer: BLUE CROSS/BLUE SHIELD | Attending: Emergency Medicine | Admitting: Emergency Medicine

## 2016-07-21 ENCOUNTER — Encounter (HOSPITAL_COMMUNITY): Payer: Self-pay | Admitting: Emergency Medicine

## 2016-07-21 DIAGNOSIS — R059 Cough, unspecified: Secondary | ICD-10-CM

## 2016-07-21 DIAGNOSIS — B349 Viral infection, unspecified: Secondary | ICD-10-CM | POA: Insufficient documentation

## 2016-07-21 DIAGNOSIS — R0981 Nasal congestion: Secondary | ICD-10-CM | POA: Diagnosis not present

## 2016-07-21 DIAGNOSIS — J029 Acute pharyngitis, unspecified: Secondary | ICD-10-CM | POA: Insufficient documentation

## 2016-07-21 DIAGNOSIS — Z87891 Personal history of nicotine dependence: Secondary | ICD-10-CM | POA: Diagnosis not present

## 2016-07-21 DIAGNOSIS — R05 Cough: Secondary | ICD-10-CM | POA: Diagnosis present

## 2016-07-21 HISTORY — DX: Obesity, unspecified: E66.9

## 2016-07-21 HISTORY — DX: Gastro-esophageal reflux disease without esophagitis: K21.9

## 2016-07-21 HISTORY — DX: Obesity, class 1: E66.811

## 2016-07-21 NOTE — Discharge Instructions (Signed)
Continue to stay well-hydrated. Gargle warm salt water and spit it out. Use chloraseptic spray as needed for sore throat. Continue to alternate between Tylenol and Ibuprofen for pain or fever. Use Mucinex for cough suppression/expectoration of mucus. Use netipot and flonase to help with nasal congestion. May consider over-the-counter Benadryl or other antihistamine to decrease secretions and for watery itchy eyes. Followup with your primary care doctor in 5-7 days for recheck of ongoing symptoms. Return to emergency department for emergent changing or worsening of symptoms. °

## 2016-07-21 NOTE — ED Notes (Signed)
Declined W/C at D/C and was escorted to lobby by RN. 

## 2016-07-21 NOTE — ED Triage Notes (Signed)
Cough sore throat and body aches since friday

## 2016-07-21 NOTE — ED Provider Notes (Signed)
[facility] Provider Note   CSN: [id number] Arrival date & time: 07/21/16  1035  By signing my name below, I, Linna Darner, attest that this documentation has been prepared under the direction and in the presence of [address], [employer]. Electronically Signed: Linna Darner, Scribe. 07/21/2016. 11:40 AM.  History   Chief Complaint Chief Complaint  Patient presents with  . Cough  . Fever    The history is provided by the patient and medical records. No language interpreter was used.  Cough  This is a new problem. The current episode started 2 days ago. The problem occurs constantly. The problem has not changed since onset.The cough is productive of sputum. There has been no fever. Associated symptoms include headaches (sinus pressure), sore throat and wheezing. Pertinent negatives include no chest pain, no chills, no ear pain, no rhinorrhea, no myalgias and no shortness of breath. Treatments tried: cough lozenges. The treatment provided moderate relief. He is not a smoker. His past medical history does not include asthma.     HPI Comments: Edy Belt is a 35 y.o. male who presents to the Emergency Department complaining of multiple cold-like symptoms beginning 3 days ago. He notes productive cough with unknown sputum color production, intermittent wheezing, sinus headache/pressure, ear itching, sore throat, and fatigue. He has tried OTC chloraseptic spray and throat lozenges with some relief of his cough and sore throat. He also notes that warm showers have helped for his respiratory symptoms. No known aggravating factors. 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 He is not a smoker. He denies ear pain/drainage, trismus, trouble swallowing, drooling, fever, chills, rhinorrhea, CP, SOB, abdominal pain, nausea, vomiting, diarrhea, constipation, dysuria, hematuria,  numbness/tingling, weakness, myalgias, rashes, or any other associated symptoms. He states he has a PCP.   Past Medical History:  Diagnosis Date  . GERD (gastroesophageal reflux disease)   . Obesity   . Obesity (BMI 30.0-34.9)   . Sleep apnea     There are no active problems to display for this patient.   Past Surgical History:  Procedure Laterality Date  . APPENDECTOMY         Home Medications    Prior to Admission medications   Medication Sig Start Date End Date Taking? Authorizing Provider  cephALEXin (KEFLEX) 500 MG capsule Take 1 capsule (500 mg total) by mouth 4 (four) times daily. Patient not taking: Reported on 06/06/2016 11/22/15   Mady Gemma, PA-C  meloxicam (MOBIC) 15 MG tablet Take 1 tablet (15 mg total) by mouth daily. 06/22/16   Tatyana Kirichenko, PA-C  ondansetron (ZOFRAN ODT) 4 MG disintegrating tablet Take 1 tablet (4 mg total) by mouth every 8 (eight) hours as needed for nausea. Patient not taking: Reported on 06/06/2016 11/22/15   Mady Gemma, PA-C    Family History No family history on file.  Social History Social History  Substance Use Topics  . Smoking status: Former Games developer  . Smokeless tobacco: Never Used  . Alcohol use Yes     Comment: occasionally     Allergies   Tylenol sinus congestion-pain [phenylephrine-acetaminophen]   Review of Systems Review of Systems  Constitutional: Positive for fatigue. Negative for chills and fever.  HENT: Positive for sinus pressure and sore throat. Negative for drooling, ear discharge, ear pain, rhinorrhea and trouble swallowing.   Respiratory: Positive for cough (productive) and wheezing. Negative for shortness of  breath.   Cardiovascular: Negative for chest pain.  Gastrointestinal: Negative for abdominal pain, constipation, diarrhea, nausea and vomiting.  Genitourinary: Negative for dysuria and hematuria.  Musculoskeletal: Negative for myalgias.  Skin: Negative for rash.    Allergic/Immunologic: Negative for immunocompromised state.  Neurological: Positive for headaches (sinus pressure). Negative for weakness and numbness.  Psychiatric/Behavioral: Negative for confusion.    10 Systems reviewed and all are negative for acute change except as noted in the HPI.  Physical Exam Updated Vital Signs BP 129/70 (BP Location: Left Arm)   Pulse 81   Temp 98.2 F (36.8 C) (Oral)   Resp 15   SpO2 99%   Physical Exam  Constitutional: He is oriented to person, place, and time. Vital signs are normal. He appears well-developed and well-nourished.  Non-toxic appearance. No distress.  Afebrile, nontoxic, NAD  HENT:  Head: Normocephalic and atraumatic.  Right Ear: Hearing, tympanic membrane, external ear and ear canal normal.  Left Ear: Hearing, tympanic membrane, external ear and ear canal normal.  Nose: Mucosal edema present.  Mouth/Throat: Uvula is midline, oropharynx is clear and moist and mucous membranes are normal. No trismus in the jaw. No uvula swelling. Tonsils are 0 on the right. Tonsils are 0 on the left.  Eyes: Conjunctivae and EOM are normal. Right eye exhibits no discharge. Left eye exhibits no discharge.  Neck: Normal range of motion. Neck supple.  Cardiovascular: Normal rate, regular rhythm, normal heart sounds and intact distal pulses.  Exam reveals no gallop and no friction rub.   No murmur heard. Pulmonary/Chest: Effort normal and breath sounds normal. No respiratory distress. He has no decreased breath sounds. He has no wheezes. He has no rhonchi. He has no rales.  CTAB in all lung fields, no w/r/r, no hypoxia or increased WOB, speaking in full sentences, SpO2 99% on RA  Abdominal: Soft. Normal appearance and bowel sounds are normal. He exhibits no distension. There is no tenderness. There is no rigidity, no rebound, no guarding, no CVA tenderness, no tenderness at McBurney's point and negative Murphy's sign.  Musculoskeletal: Normal range of motion.   Neurological: He is alert and oriented to person, place, and time. He has normal strength. No sensory deficit.  Skin: Skin is warm, dry and intact. No rash noted.  Psychiatric: He has a normal mood and affect.  Nursing note and vitals reviewed.    ED Treatments / Results  Labs (all labs ordered are listed, but only abnormal results are displayed) Labs Reviewed - No data to display  EKG  EKG Interpretation None       Radiology No results found.  Procedures Procedures (including critical care time)  DIAGNOSTIC STUDIES: Oxygen Saturation is 99% on RA, normal by my interpretation.    COORDINATION OF CARE: 11:45 AM Discussed treatment plan with pt at bedside and pt agreed to plan.  Medications Ordered in ED Medications - No data to display   Initial Impression / Assessment and Plan / ED Course  I have reviewed the triage vital signs and the nursing notes.  Pertinent labs & imaging results that were available during my care of the patient were reviewed by me and considered in my medical decision making (see chart for details).  Clinical Course    35 y.o. male here with URI symptoms including cough, sore throat, intermittent wheezing, and sinus pressure/headaches x3 days. Pt is afebrile with a clear lung exam. Mild nasal congestion. Clear throat exam. Likely viral URI. Pt is agreeable to symptomatic treatment  with close follow up with PCP as needed but spoke at length about emergent changing or worsening of symptoms that should prompt return to ER. Pt voices understanding and is agreeable to plan. Stable at time of discharge.   I personally performed the services described in this documentation, which was scribed in my presence. The recorded information has been reviewed and is accurate.   Final Clinical Impressions(s) / ED Diagnoses   Final diagnoses:  Viral illness  Cough  Sore throat  Sinus congestion    New Prescriptions New Prescriptions   No medications on  file     Allen DerryMercedes Camprubi-Soms, PA-C 07/21/16 1209    Laurence Spatesachel Morgan Little, MD 07/23/16 1558

## 2016-11-04 ENCOUNTER — Encounter (HOSPITAL_COMMUNITY): Payer: Self-pay | Admitting: Emergency Medicine

## 2016-11-04 ENCOUNTER — Emergency Department (HOSPITAL_COMMUNITY)
Admission: EM | Admit: 2016-11-04 | Discharge: 2016-11-04 | Disposition: A | Discharge status: Home / Self Care | Payer: BLUE CROSS/BLUE SHIELD | Attending: Dermatology | Admitting: Dermatology

## 2016-11-04 DIAGNOSIS — Z5321 Procedure and treatment not carried out due to patient leaving prior to being seen by health care provider: Secondary | ICD-10-CM | POA: Insufficient documentation

## 2016-11-04 DIAGNOSIS — M545 Low back pain: Secondary | ICD-10-CM | POA: Insufficient documentation

## 2016-11-04 NOTE — ED Triage Notes (Signed)
PT encouraged to stay . Pt reported he planned to go to HIS PCP on WED

## 2016-11-04 NOTE — ED Triage Notes (Signed)
Pt sts lower back pain x 1 month

## 2017-10-01 DIAGNOSIS — R300 Dysuria: Secondary | ICD-10-CM | POA: Diagnosis not present

## 2017-10-01 DIAGNOSIS — R197 Diarrhea, unspecified: Secondary | ICD-10-CM | POA: Diagnosis not present

## 2017-10-13 ENCOUNTER — Other Ambulatory Visit: Payer: Self-pay

## 2017-10-13 ENCOUNTER — Encounter: Payer: Self-pay | Admitting: Internal Medicine

## 2017-10-13 ENCOUNTER — Ambulatory Visit: Payer: BLUE CROSS/BLUE SHIELD | Admitting: Internal Medicine

## 2017-10-13 VITALS — BP 143/91 | HR 78 | Temp 97.7°F | Ht 65.0 in | Wt 360.2 lb

## 2017-10-13 DIAGNOSIS — Z7289 Other problems related to lifestyle: Secondary | ICD-10-CM | POA: Diagnosis not present

## 2017-10-13 DIAGNOSIS — F1090 Alcohol use, unspecified, uncomplicated: Secondary | ICD-10-CM

## 2017-10-13 DIAGNOSIS — R3915 Urgency of urination: Secondary | ICD-10-CM

## 2017-10-13 DIAGNOSIS — Z0000 Encounter for general adult medical examination without abnormal findings: Secondary | ICD-10-CM

## 2017-10-13 DIAGNOSIS — M7662 Achilles tendinitis, left leg: Secondary | ICD-10-CM | POA: Diagnosis not present

## 2017-10-13 DIAGNOSIS — N3944 Nocturnal enuresis: Secondary | ICD-10-CM | POA: Insufficient documentation

## 2017-10-13 DIAGNOSIS — R030 Elevated blood-pressure reading, without diagnosis of hypertension: Secondary | ICD-10-CM | POA: Diagnosis not present

## 2017-10-13 DIAGNOSIS — Z6841 Body Mass Index (BMI) 40.0 and over, adult: Secondary | ICD-10-CM | POA: Diagnosis not present

## 2017-10-13 DIAGNOSIS — Z888 Allergy status to other drugs, medicaments and biological substances status: Secondary | ICD-10-CM

## 2017-10-13 DIAGNOSIS — Z9049 Acquired absence of other specified parts of digestive tract: Secondary | ICD-10-CM | POA: Diagnosis not present

## 2017-10-13 DIAGNOSIS — M7661 Achilles tendinitis, right leg: Secondary | ICD-10-CM

## 2017-10-13 DIAGNOSIS — Z87891 Personal history of nicotine dependence: Secondary | ICD-10-CM

## 2017-10-13 DIAGNOSIS — R7309 Other abnormal glucose: Secondary | ICD-10-CM | POA: Diagnosis not present

## 2017-10-13 DIAGNOSIS — K219 Gastro-esophageal reflux disease without esophagitis: Secondary | ICD-10-CM

## 2017-10-13 DIAGNOSIS — G4733 Obstructive sleep apnea (adult) (pediatric): Secondary | ICD-10-CM | POA: Diagnosis not present

## 2017-10-13 LAB — POCT URINALYSIS DIPSTICK
GLUCOSE UA: NEGATIVE
Ketones, UA: NEGATIVE
Nitrite, UA: NEGATIVE
PH UA: 5.5 (ref 5.0–8.0)
Protein, UA: NEGATIVE
RBC UA: NEGATIVE
UROBILINOGEN UA: 1 U/dL

## 2017-10-13 LAB — GLUCOSE, CAPILLARY: Glucose-Capillary: 85 mg/dL (ref 65–99)

## 2017-10-13 LAB — POCT GLYCOSYLATED HEMOGLOBIN (HGB A1C): Hemoglobin A1C: 5.5

## 2017-10-13 NOTE — Progress Notes (Signed)
   CC: enuresis  HPI:  Mr.Makel Laurence AlyCorbett is a 37 y.o. with a PMH of OSA not on CPAP and morbid obesity presenting to clinic to establish care and for evaluation of enuresis.  Enuresis: Patient reports nocturnal enuresis since childhood with onset of daytime symptoms a couple of years ago. He reports the symptoms as intermittent (will have stretches of weeks with no symptoms) and has not been able to associate any triggers. He endorses urgency; he denies hesitancy, dysuria, hematuria, suprapubic pressure, penile discharge, skin changes, fevers, chills, flank pain. He has stopped drinking coffee about 2 weeks ago with no change in symptoms. He reports being evaluated by urology about 2 years ago but not being able to follow up afterwards due to his work schedule.   GERD: Patient endorses acid reflux symptoms, mainly at night about once per week. He has tried cutting out spicy foods, stopped drinking coffee without too much improvement in symptoms. He denies using NSAIDs regularly. 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 He denies abd pain, nausea, vomiting, melena, hematochezia.   Bil heel pain: Patient endorses a year long h/o bil, intermittent heel pain which is worse when taking steps. He has changed to using more supportive shoes but his symptoms persist. He is unable to point out what triggers onset of his symptoms, and states they are self resolving after a few days. He denies trauma, swelling, skin changes.  Please see problem based Assessment and Plan for status of patients chronic conditions.  Past Medical History:  Diagnosis Date  . GERD (gastroesophageal reflux disease)   . Obesity   . Obesity (BMI 30.0-34.9)   . Sleep apnea    PSHx:  Appendectomy  FHx: Mother- diabetes Sister - hyperlipidemia  Allergies: Tylenol sinus OTC - hives; but ok taking tylenol alone  Social: Engaged, works as a Naval architecttruck driver Former ~2 cigarettes per day  smoker in his 3020s; ~1 EtOH drink per month; denies prior or recent illicit drug use  Review of Systems:   ROS  Denies chest pain, shortness of breath, headaches, vision or hearing changes  Physical Exam:  Vitals:   10/13/17 1435  BP: (!) 143/91  Pulse: 78  Temp: 97.7 F (36.5 C)  TempSrc: Oral  Weight: (!) 360 lb 3.2 oz (163.4 kg)  Height: 5\' 5"  (1.651 m)   GENERAL- alert, co-operative, appears as stated age, not in any distress, obese HEENT- EOMI, oral mucosa appears moist CARDIAC- RRR, no murmurs, rubs or gallops. RESP- Moving equal volumes of air, and clear to auscultation bilaterally, no wheezes or crackles. ABDOMEN- Soft, nontender, bowel sounds present. GU - prostate exam without tenderness, nodularity NEURO- No obvious Cr N abnormality. EXTREMITIES- pulse 2+ DP, symmetric, no pedal edema; mild tenderness with palpation of medial aspect of R achilles tendon insertion point. SKIN- Warm, dry, no rash or lesion. PSYCH- Normal mood and affect, appropriate thought content and speech.  Assessment & Plan:   See Encounters Tab for problem based charting.   Patient discussed with Dr. Lorin PicketVincent   Ladislaus Repsher, MD Internal Medicine PGY2

## 2017-10-13 NOTE — Patient Instructions (Signed)

## 2017-10-14 DIAGNOSIS — K219 Gastro-esophageal reflux disease without esophagitis: Secondary | ICD-10-CM | POA: Insufficient documentation

## 2017-10-14 DIAGNOSIS — I10 Essential (primary) hypertension: Secondary | ICD-10-CM | POA: Insufficient documentation

## 2017-10-14 DIAGNOSIS — M7661 Achilles tendinitis, right leg: Secondary | ICD-10-CM | POA: Insufficient documentation

## 2017-10-14 DIAGNOSIS — Z0000 Encounter for general adult medical examination without abnormal findings: Secondary | ICD-10-CM | POA: Insufficient documentation

## 2017-10-14 DIAGNOSIS — M7662 Achilles tendinitis, left leg: Secondary | ICD-10-CM | POA: Insufficient documentation

## 2017-10-14 LAB — HIV ANTIBODY (ROUTINE TESTING W REFLEX): HIV Screen 4th Generation wRfx: NONREACTIVE

## 2017-10-14 NOTE — Assessment & Plan Note (Signed)
Patient with symptoms of bil achilles tendinitis.  Plan: --patient provided with stretching exercises --patient to work on increasing activity and losing weight

## 2017-10-14 NOTE — Assessment & Plan Note (Signed)
Patient with mildly elevated BP today and intermittent elevated readings over the last 2 years. He has untreated OSA as well.  Plan: --continue monitoring for now --patient to start using CPAP regularly and work on weight loss --if BP still elevated at f/u, would start antihypertensive medication (avoid diuretic)

## 2017-10-14 NOTE — Assessment & Plan Note (Signed)
Patient declines flu and Tdap vaccination today.  DM screening in setting of morbid obesity - A1c 5.5  HIV screening - nonreactive

## 2017-10-14 NOTE — Assessment & Plan Note (Signed)
Referral to nutritionist placed for assistance with diet changes. Patient has been thinking about surgical options but hesitant. In the future, can consider medications thx if patient agreeable.  Screening HgbA1c obtained today - 5.5.

## 2017-10-14 NOTE — Assessment & Plan Note (Addendum)
Advised patient to begin using his CPAP again. He will contact [facility] for evaluation of his equipment.

## 2017-10-14 NOTE — Assessment & Plan Note (Signed)
Patient endorses acid reflux symptoms, mainly at night about once per week. He has tried cutting out spicy foods, stopped drinking coffee without too much improvement in symptoms. He denies using NSAIDs regularly. 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 He denies abd pain, nausea, vomiting, melena, hematochezia.  Patient taking OTC acid reducer (doesn't remember name); he is not interested in switching to PPI thx at this time.  Plan: --pt to continue PRN OTC acid reducer --we discussed diet changes and weight loss for control of symptoms

## 2017-10-14 NOTE — Assessment & Plan Note (Signed)
Patient reports nocturnal enuresis since childhood with onset of daytime symptoms a couple of years ago. He reports the symptoms as intermittent (will have stretches of weeks with no symptoms) and has not been able to associate any triggers. He endorses urgency; he denies hesitancy, dysuria, hematuria, suprapubic pressure, penile discharge, skin changes, fevers, chills, flank pain. He has stopped drinking coffee about 2 weeks ago with no change in symptoms. He reports being evaluated by urology about 2 years ago but not being able to follow up afterwards due to his work schedule.   UA with small leukocytes.  Symptoms and exam are not consistent with UTI, STI, prostatitis. He does have untreated OSA which has been linked to enuresis.  Plan: --advised patient to start using CPAP regularly --if no improvement, can plan on referral to urology in the future

## 2017-10-15 NOTE — Progress Notes (Signed)
Internal Medicine Clinic Attending  Case discussed with Dr. Svalina  at the time of the visit.  We reviewed the resident's history and exam and pertinent patient test results.  I agree with the assessment, diagnosis, and plan of care documented in the resident's note.  

## 2017-11-26 ENCOUNTER — Ambulatory Visit (INDEPENDENT_AMBULATORY_CARE_PROVIDER_SITE_OTHER): Payer: BLUE CROSS/BLUE SHIELD | Admitting: Dietician

## 2017-11-26 ENCOUNTER — Ambulatory Visit: Payer: BLUE CROSS/BLUE SHIELD | Admitting: Internal Medicine

## 2017-11-26 ENCOUNTER — Encounter: Payer: Self-pay | Admitting: Internal Medicine

## 2017-11-26 ENCOUNTER — Encounter (INDEPENDENT_AMBULATORY_CARE_PROVIDER_SITE_OTHER): Payer: Self-pay

## 2017-11-26 DIAGNOSIS — I10 Essential (primary) hypertension: Secondary | ICD-10-CM | POA: Diagnosis not present

## 2017-11-26 DIAGNOSIS — Z6841 Body Mass Index (BMI) 40.0 and over, adult: Secondary | ICD-10-CM | POA: Diagnosis not present

## 2017-11-26 DIAGNOSIS — N3944 Nocturnal enuresis: Secondary | ICD-10-CM | POA: Diagnosis not present

## 2017-11-26 DIAGNOSIS — Z79899 Other long term (current) drug therapy: Secondary | ICD-10-CM | POA: Diagnosis not present

## 2017-11-26 DIAGNOSIS — K219 Gastro-esophageal reflux disease without esophagitis: Secondary | ICD-10-CM | POA: Diagnosis not present

## 2017-11-26 DIAGNOSIS — Z713 Dietary counseling and surveillance: Secondary | ICD-10-CM | POA: Diagnosis not present

## 2017-11-26 DIAGNOSIS — G4733 Obstructive sleep apnea (adult) (pediatric): Secondary | ICD-10-CM | POA: Diagnosis not present

## 2017-11-26 MED ORDER — AMLODIPINE BESYLATE 5 MG PO TABS: 5.0000 mg | ORAL_TABLET | Freq: Every day | ORAL | 2 refills | Status: DC

## 2017-11-26 NOTE — Assessment & Plan Note (Signed)
Patient states that he has not been using his CPAP because he feels that it does not make a difference with his daily fatigue. He also feels that it makes his acid reflux worse. He recently saw his pulmonologist and attempted to decrease CPAP settings to make it more comfortable for the patient. However, the patient states that he has not been using his CPAP. We discussed the importance of using his CPAP both for his blood pressure and heart health. He will follow-up with his pulmonologist so they can modify his CPAP and start using it.

## 2017-11-26 NOTE — Progress Notes (Signed)
Internal Medicine Clinic Attending  Case discussed with Dr. Helberg at the time of the visit.  We reviewed the resident's history and exam and pertinent patient test results.  I agree with the assessment, diagnosis, and plan of care documented in the resident's note.    

## 2017-11-26 NOTE — Patient Instructions (Addendum)
It was a pleasure to meet you. Please work on lifestyle changes to aid in your weight loss including: cutting out calorie drinks, eating a more balanced diet (cutting out fast food), and exercising.   Your goal weight in 3 months is: 348lbs  I will see you back in 3 months for a weight check and blood pressure check. Please follow-up with your sleep doctor on your CPAP settings.   Calorie Counting for Weight Loss Calories are units of energy. Your body needs a certain amount of calories from food to keep you going throughout the day. When you eat more calories than your body needs, your body stores the extra calories as fat. When you eat fewer calories than your body needs, your body burns fat to get the energy it needs. Calorie counting means keeping track of how many calories you eat and drink each day. Calorie counting can be helpful if you need to lose weight. If you make sure to eat fewer calories than your body needs, you should lose weight. Ask your health care provider what a healthy weight is for you. For calorie counting to work, you will need to eat the right number of calories in a day in order to lose a healthy amount of weight per week. A dietitian can help you determine how many calories you need in a day and will give you suggestions on how to reach your calorie goal.  A healthy amount of weight to lose per week is usually 1-2 lb (0.5-0.9 kg). This usually means that your daily calorie intake should be reduced by 500-750 calories.  Eating 1,200 - 1,500 calories per day can help most women lose weight.  Eating 1,500 - 1,800 calories per day can help most men lose weight.  What is my plan? My goal is to have __________ calories per day. If I have this many calories per day, I should lose around __________ pounds per week. What do I need to know about calorie counting? In order to meet your daily calorie goal, you will need to:  Find out how many calories are in each food you  would like to eat. Try to do this before you eat.  Decide how much of the food you plan to eat.  Write down what you ate and how many calories it had. Doing this is called keeping a food log.  To successfully lose weight, it is important to balance calorie counting with a healthy lifestyle that includes regular activity. Aim for 150 minutes of moderate exercise (such as walking) or 75 minutes of vigorous exercise (such as running) each week. Where do I find calorie information?  The number of calories in a food can be found on a Nutrition Facts label. If a food does not have a Nutrition Facts label, try to look up the calories online or ask your dietitian for help. Remember that calories are listed per serving. If you choose to have more than one serving of a food, you will have to multiply the calories per serving by the amount of servings you plan to eat. For example, the label on a package of bread might say that a serving size is 1 slice and that there are 90 calories in a serving. If you eat 1 slice, you will have eaten 90 calories. If you eat 2 slices, you will have eaten 180 calories. How do I keep a food log? Immediately after each meal, record the following information in your food log:  What you ate. Don't forget to include toppings, sauces, and other extras on the food.  How much you ate. This can be measured in cups, ounces, or number of items.  How many calories each food and drink had.  The total number of calories in the meal.  Keep your food log near you, such as in a small notebook in your pocket, or use a mobile app or website. Some programs will calculate calories for you and show you how many calories you have left for the day to meet your goal. What are some calorie counting tips?  Use your calories on foods and drinks that will fill you up and not leave you hungry: ? Some examples of foods that fill you up are nuts and nut butters, vegetables, lean proteins, and  high-fiber foods like whole grains. High-fiber foods are foods with more than 5 g fiber per serving. ? Drinks such as sodas, specialty coffee drinks, alcohol, and juices have a lot of calories, yet do not fill you up.  Eat nutritious foods and avoid empty calories. Empty calories are calories you get from foods or beverages that do not have many vitamins or protein, such as candy, sweets, and soda. It is better to have a nutritious high-calorie food (such as an avocado) than a food with few nutrients (such as a bag of chips).  Know how many calories are in the foods you eat most often. This will help you calculate calorie counts faster.  Pay attention to calories in drinks. Low-calorie drinks include water and unsweetened drinks.  Pay attention to nutrition labels for "low fat" or "fat free" foods. These foods sometimes have the same amount of calories or more calories than the full fat versions. They also often have added sugar, starch, or salt, to make up for flavor that was removed with the fat.  Find a way of tracking calories that works for you. Get creative. Try different apps or programs if writing down calories does not work for you. What are some portion control tips?  Know how many calories are in a serving. This will help you know how many servings of a certain food you can have.  Use a measuring cup to measure serving sizes. You could also try weighing out portions on a kitchen scale. With time, you will be able to estimate serving sizes for some foods.  Take some time to put servings of different foods on your favorite plates, bowls, and cups so you know what a serving looks like.  Try not to eat straight from a bag or box. Doing this can lead to overeating. Put the amount you would like to eat in a cup or on a plate to make sure you are eating the right portion.  Use smaller plates, glasses, and bowls to prevent overeating.  Try not to multitask (for example, watch TV or use  your computer) while eating. If it is time to eat, sit down at a table and enjoy your food. This will help you to know when you are full. It will also help you to be aware of what you are eating and how much you are eating. What are tips for following this plan? Reading food labels  Check the calorie count compared to the serving size. The serving size may be smaller than what you are used to eating.  Check the source of the calories. Make sure the food you are eating is high in vitamins and protein and low  in saturated and trans fats. Shopping  Read nutrition labels while you shop. This will help you make healthy decisions before you decide to purchase your food.  Make a grocery list and stick to it. Cooking  Try to cook your favorite foods in a healthier way. For example, try baking instead of frying.  Use low-fat dairy products. Meal planning  Use more fruits and vegetables. Half of your plate should be fruits and vegetables.  Include lean proteins like poultry and fish. How do I count calories when eating out?  Ask for smaller portion sizes.  Consider sharing an entree and sides instead of getting your own entree.  If you get your own entree, eat only half. Ask for a box at the beginning of your meal and put the rest of your entree in it so you are not tempted to eat it.  If calories are listed on the menu, choose the lower calorie options.  Choose dishes that include vegetables, fruits, whole grains, low-fat dairy products, and lean protein.  Choose items that are boiled, broiled, grilled, or steamed. Stay away from items that are buttered, battered, fried, or served with cream sauce. Items labeled "crispy" are usually fried, unless stated otherwise.  Choose water, low-fat milk, unsweetened iced tea, or other drinks without added sugar. If you want an alcoholic beverage, choose a lower calorie option such as a glass of wine or light beer.  Ask for dressings, sauces, and  syrups on the side. These are usually high in calories, so you should limit the amount you eat.  If you want a salad, choose a garden salad and ask for grilled meats. Avoid extra toppings like bacon, cheese, or fried items. Ask for the dressing on the side, or ask for olive oil and vinegar or lemon to use as dressing.  Estimate how many servings of a food you are given. For example, a serving of cooked rice is  cup or about the size of half a baseball. Knowing serving sizes will help you be aware of how much food you are eating at restaurants. The list below tells you how big or small some common portion sizes are based on everyday objects: ? 1 oz-4 stacked dice. ? 3 oz-1 deck of cards. ? 1 tsp-1 die. ? 1 Tbsp- a ping-pong ball. ? 2 Tbsp-1 ping-pong ball. ?  cup- baseball. ? 1 cup-1 baseball. Summary  Calorie counting means keeping track of how many calories you eat and drink each day. If you eat fewer calories than your body needs, you should lose weight.  A healthy amount of weight to lose per week is usually 1-2 lb (0.5-0.9 kg). This usually means reducing your daily calorie intake by 500-750 calories.  The number of calories in a food can be found on a Nutrition Facts label. If a food does not have a Nutrition Facts label, try to look up the calories online or ask your dietitian for help.  Use your calories on foods and drinks that will fill you up, and not on foods and drinks that will leave you hungry.  Use smaller plates, glasses, and bowls to prevent overeating. This information is not intended to replace advice given to you by your health care provider. Make sure you discuss any questions you have with your health care provider. Document Released: 09/15/2005 Document Revised: 08/15/2016 Document Reviewed: 08/15/2016   Exercising to Lose Weight Exercising can help you to lose weight. In order to lose weight through exercise, you  need to do vigorous-intensity exercise. You can  tell that you are exercising with vigorous intensity if you are breathing very hard and fast and cannot hold a conversation while exercising. Moderate-intensity exercise helps to maintain your current weight. You can tell that you are exercising at a moderate level if you have a higher heart rate and faster breathing, but you are still able to hold a conversation. How often should I exercise? Choose an activity that you enjoy and set realistic goals. Your health care provider can help you to make an activity plan that works for you. Exercise regularly as directed by your health care provider. This may include:  Doing resistance training twice each week, such as: ? Push-ups. ? Sit-ups. ? Lifting weights. ? Using resistance bands.  Doing a given intensity of exercise for a given amount of time. Choose from these options: ? 150 minutes of moderate-intensity exercise every week. ? 75 minutes of vigorous-intensity exercise every week. ? A mix of moderate-intensity and vigorous-intensity exercise every week.  Children, pregnant women, people who are out of shape, people who are overweight, and older adults may need to consult a health care provider for individual recommendations. If you have any sort of medical condition, be sure to consult your health care provider before starting a new exercise program. What are some activities that can help me to lose weight?  Walking at a rate of at least 4.5 miles an hour.  Jogging or running at a rate of 5 miles per hour.  Biking at a rate of at least 10 miles per hour.  Lap swimming.  Roller-skating or in-line skating.  Cross-country skiing.  Vigorous competitive sports, such as football, basketball, and soccer.  Jumping rope.  Aerobic dancing. How can I be more active in my day-to-day activities?  Use the stairs instead of the elevator.  Take a walk during your lunch break.  If you drive, park your car farther away from work or  school.  If you take public transportation, get off one stop early and walk the rest of the way.  Make all of your phone calls while standing up and walking around.  Get up, stretch, and walk around every 30 minutes throughout the day. What guidelines should I follow while exercising?  Do not exercise so much that you hurt yourself, feel dizzy, or get very short of breath.  Consult your health care provider prior to starting a new exercise program.  Wear comfortable clothes and shoes with good support.  Drink plenty of water while you exercise to prevent dehydration or heat stroke. Body water is lost during exercise and must be replaced.  Work out until you breathe faster and your heart beats faster. This information is not intended to replace advice given to you by your health care provider. Make sure you discuss any questions you have with your health care provider. Document Released: 10/18/2010 Document Revised: 02/21/2016 Document Reviewed: 02/16/2014 Elsevier Interactive Patient Education  2018 [employer].  Risk analyst Patient Education  [employer].

## 2017-11-26 NOTE — Assessment & Plan Note (Signed)
Patient previously evaluated in the clinic of 1/16 for this problem. States that it has continued to persist. He has not started to use his CPAP. He is requesting that he be referred to urology for further evaluation. I think this is an appropriate plan. If you only other symptoms that he endorses this urgency. He denies weeks string, hesitancy, dysuria, hematuria, abdominal pain, bloody ejaculation.  Will refer to urology

## 2017-11-26 NOTE — Assessment & Plan Note (Signed)
Patient presents to discuss weight loss options. 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 With this he is not able to eat a balanced diet and eats a lot of fast food/snacks. He also drinks a lot of juices. 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 He is also concerned about his high blood pressure and does not want to start medications. He would like to get his health in order. He has tried weight loss medication in the past with good results; however, after stopping the medication his weight rebounded. The majority of his family is obese as well. Patient also has a history of obstructive sleep apnea and has not been using his CPAP because he feels that he does not make a difference and is making his acid reflux worse. He last saw his pulmonologist on January 2019.  We discussed the various options of weight loss including lifestyle modification, medication management, and surgical options. We discussed that the foundation of maintaining weight loss is lifestyle modification. We discussed changing his diet to involve less snacks and fast food, and cutting out calorie drinks. We also discussed methods to incorporate exercise into his daily habits including, walking around his truck while getting gas or doing chair exercises at night. We briefly discussed medication options and surgical options. Together we decided to focus on lifestyle modification to create a good foundation then using medications in the future to help him with weight loss. He set a weight loss goal 15 pounds in three months which would bring his weight down to 348 pounds. In the meantime we will treat his obesity comorbidities including his hypertension and obstructive sleep apnea. Please  refer to those problems for detailed evaluation and management plan.

## 2017-11-26 NOTE — Progress Notes (Signed)
  Medical Nutrition Therapy:  Appt start time: 1520 end time:  1630. Visit # 1  Assessment:  Primary concerns today: assistance with weight loss Mr. Steve Hurst had already set a short term goal with his physician of losing 15# in three months. He eats out most of the time, drinks a good portion of his calories. He eats two meals a day with 2 snacks. Foods are mostly processed with few fruits, vegetables, whole grains, nuts or seeds. He reports finances are a barrier. He estimated his daily expenditure on food was ~ 30-40$/day. He reports his sleeping pattern is a problem for him. He works third shift and his sleep is broken and not good because his roomates are noisy. He has thought about bariatric surgery and may consider in the future. Preferred Learning Style: No preference indicated. He sometimes eats with his fiance who cooks. He lives with roommates and does not food shop or keep food at his house.  Learning Readiness: says he is Ready a "10" ANTHROPOMETRICS: weight-363#  BMI-60.47 WEIGHT HISTORY: Highest: - current Lowest- 335# in 2016 MEDICATIONS: norvasc LABS:a1c-5.5%,  DIETARY INTAKE: Avoided foods/Food Intolerances: lactose,milk He reports problems with "mushy" stools.  Dining Out (times/week): at least 90% of his food and beverages 24-hr recall estimated at ~3000 calories per day as reproted Snk ( 1-3 AM): 2 starbucks iced coffees and 20 oz juice L (11AM -1  PM): [employer]- cheeseburger, large fry, large regular soda Snk ( PM): honey bun & juice D ( PM): Jakes dinner: fried chicken wings, extra large serving of french fries, cake, juice Snk ( 3-5 AM): Popcorn and soda  Estimated daily energy needs per NIDDK body weight planner: In order to maintain current weight, 3,500 Calories/day To reach goal of  348 lbs in 90 days, 2,708 Calories/day To maintain  goal  of 348 lbs,  3,423 Calories/day  Progress Towards Goal(s):  In progress.   Nutritional Diagnosis:  NI-1.5 Excessive  energy intake As related to large portions of high calorie foods.  As evidenced by his 24 hour recall and BMI.    Intervention:  Nutrition education about weight loss, what calories are and their importance in weight loss, behavior changes, used motivation interviewing to assist patient in making a plan to achieve his short and long tern weight loss goals. Coordination of care: recommend weight loss medication and bariatric surgery if patient agrees  Teaching Method Utilized: Visual, Auditory, Hands on Handouts given during visit include: goal planning worksheet, AVS, Smoothie recipe and shopping list Barriers to learning/adherence to lifestyle change: living situation, excess weight may limit physical activity Demonstrated degree of understanding via:  Teach Back   Monitoring/Evaluation:  Dietary intake, exercise, and body weight in 3 week(s).

## 2017-11-26 NOTE — Progress Notes (Signed)
   CC: Weight loss  HPI:  [name] is a 37 y.o. with morbid obesity who presented to the clinical for continued evaluation of his obesity and related comorbidites. For a detailed evaluation and management strategy please refer to problem based charting below.   Past Medical History:  Diagnosis Date  . GERD (gastroesophageal reflux disease)   . Obesity (BMI 30.0-34.9)   . Sleep apnea    not on CPAP   Review of Systems:  12 point ROS preformed. All negative aside from those mentioned in the HPI.  Physical Exam: Vitals:   11/26/17 1349  BP: (!) 159/97  Pulse: 98  Temp: 98.2 F (36.8 C)  TempSrc: Oral  SpO2: 98%  Weight: (!) 363 lb 6.4 oz (164.8 kg)  Height: 5\' 5"  (1.651 m)   General: Well nourished male in no acute distress HENT: Normocephalic, atraumatic, moist mucus membranes Pulm: Good air movement with no wheezing or crackles  CV: RRR, no murmurs, no rubs  Abdomen: Active bowel sounds, soft, non-distended, no tenderness to palpation  Extremities: Pulses palpable in all extremities, no LE edema  Skin: Warm and dry  Neuro: Alert and oriented x 3  Assessment & Plan:   See Encounters Tab for problem based charting.  Patient discussed with Dr. Heide SparkNarendra

## 2017-11-26 NOTE — Patient Instructions (Addendum)
Great meeting with you today!  Your goal is to do what you said you want to do in the amount of time you said you would do it to help you reach your goal of weight loss.   We'll follow up to se how you are doing with those goals  Calorie is the amount fo energy the body can get from food.  Fat is stored  Calories.  Weight loss is taking those calories out and burning the calories to get rid of them.

## 2017-11-26 NOTE — Assessment & Plan Note (Signed)
Patient has had two blood pressure recordings greater than 140/80 on two separate office visits qualifying him for the diagnosis of hypertension. This is likely related to his morbid obesity. 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 Instead we will start amlodipine 5 mg daily. We discussed as he loses weight he will likely be able to come off this medication. We discussed side effects of amlodipine including constipation and lower extremity swelling/edema. He agrees with plan.

## 2017-12-14 ENCOUNTER — Ambulatory Visit: Payer: BLUE CROSS/BLUE SHIELD | Admitting: Dietician

## 2018-03-08 NOTE — Addendum Note (Signed)
Addended by: Neomia DearPOWERS, Jahmiya Guidotti E on: 03/08/2018 06:09 PM   Modules accepted: Orders

## 2018-03-23 IMAGING — DX DG KNEE COMPLETE 4+V*L*
4 series · 4 of 4 positions shown · non-contrast
Comparison: None.

CLINICAL DATA: Lateral knee pain

EXAM:
LEFT KNEE - COMPLETE 4+ VIEW

[knee ap]
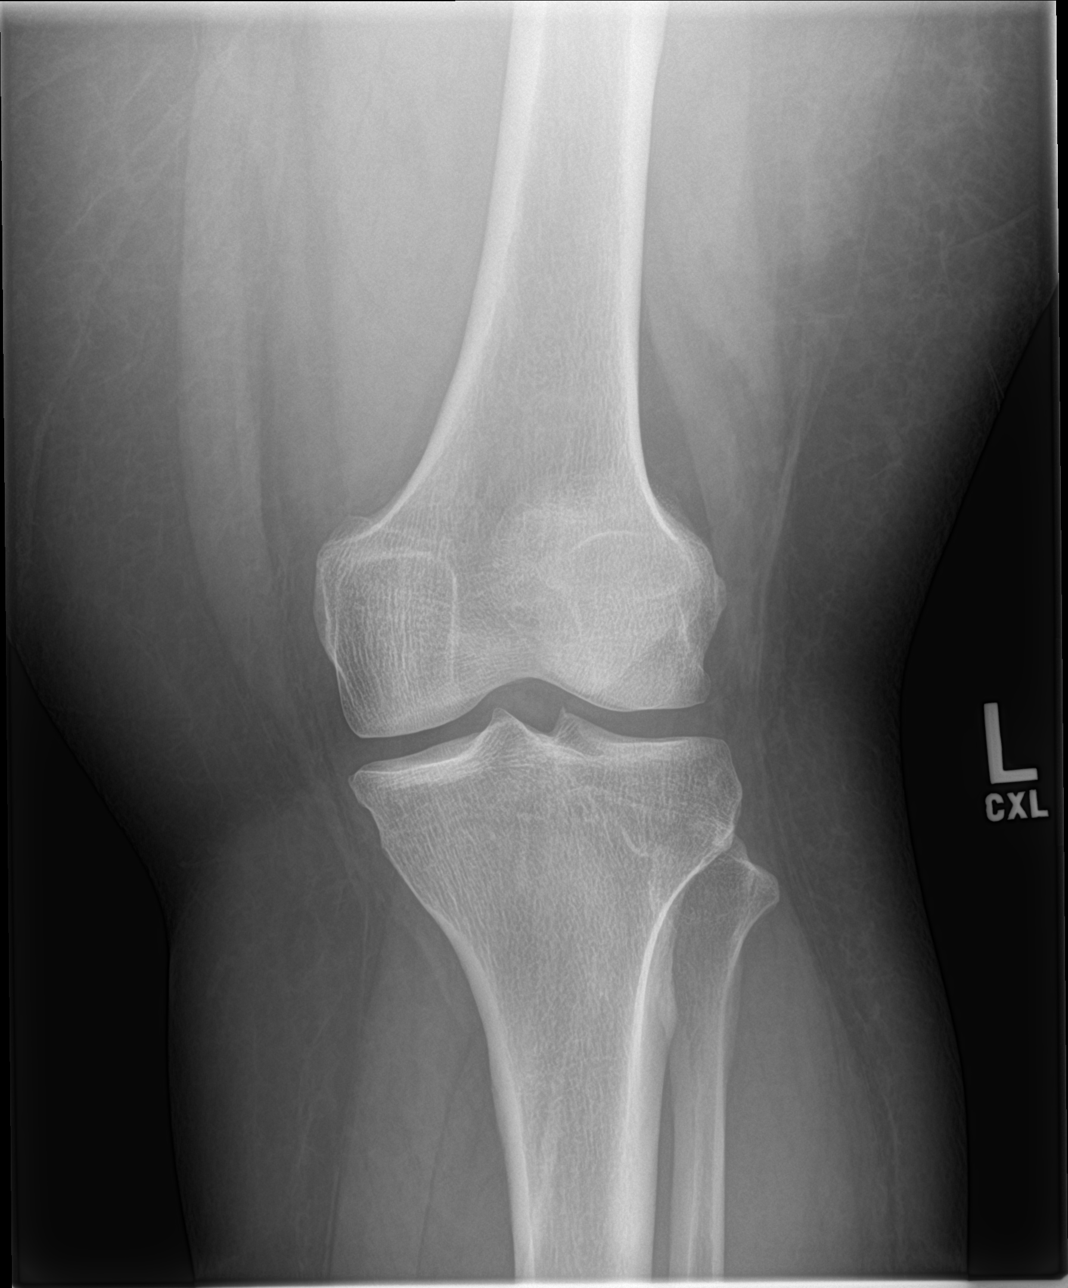

[knee lat]
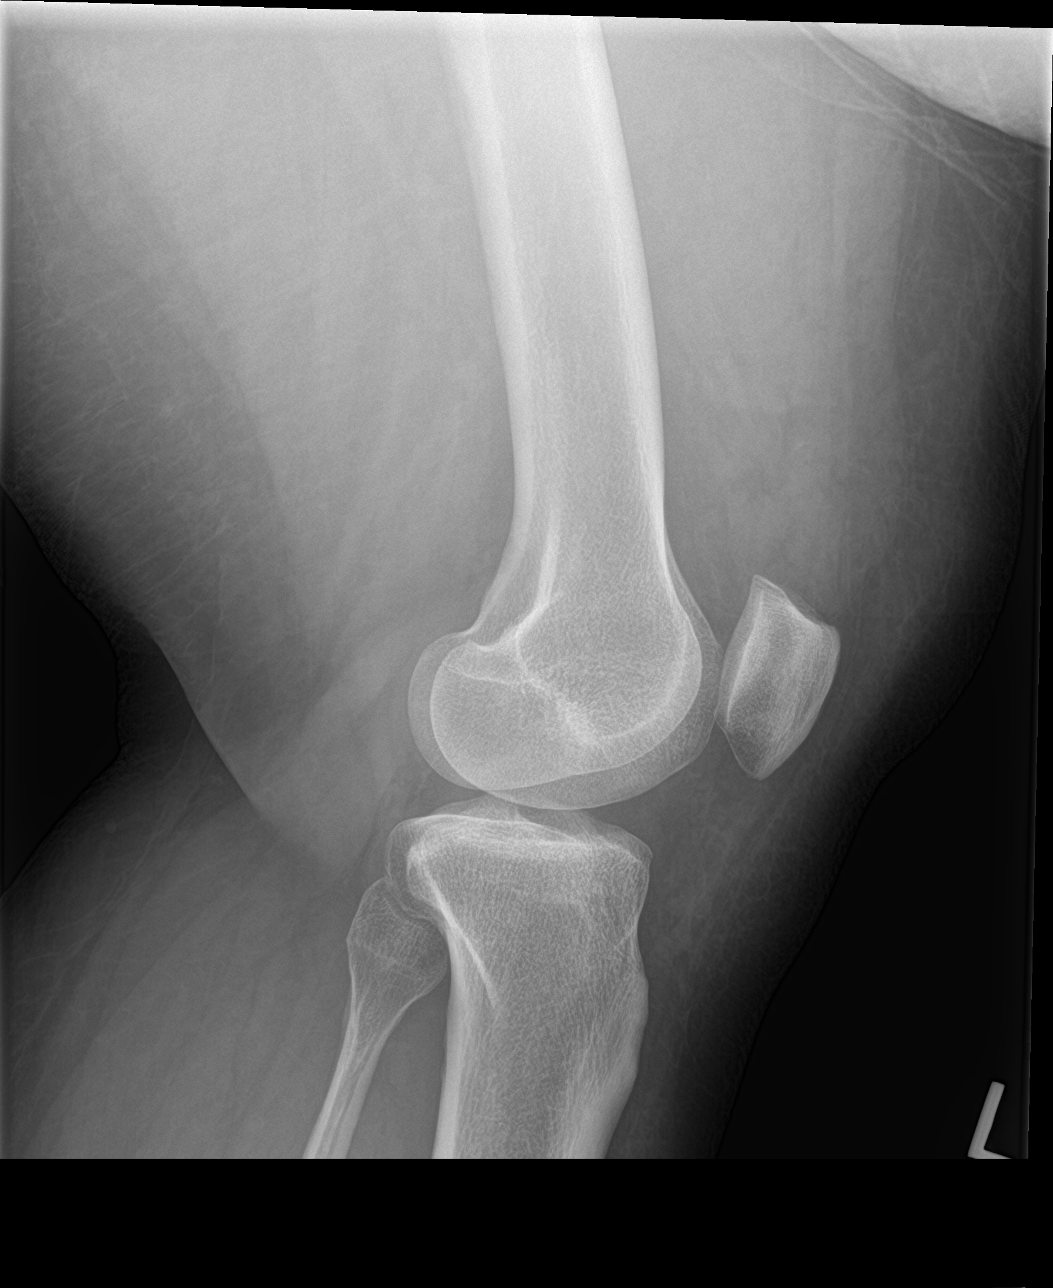

[knee obl (1 of 2)]
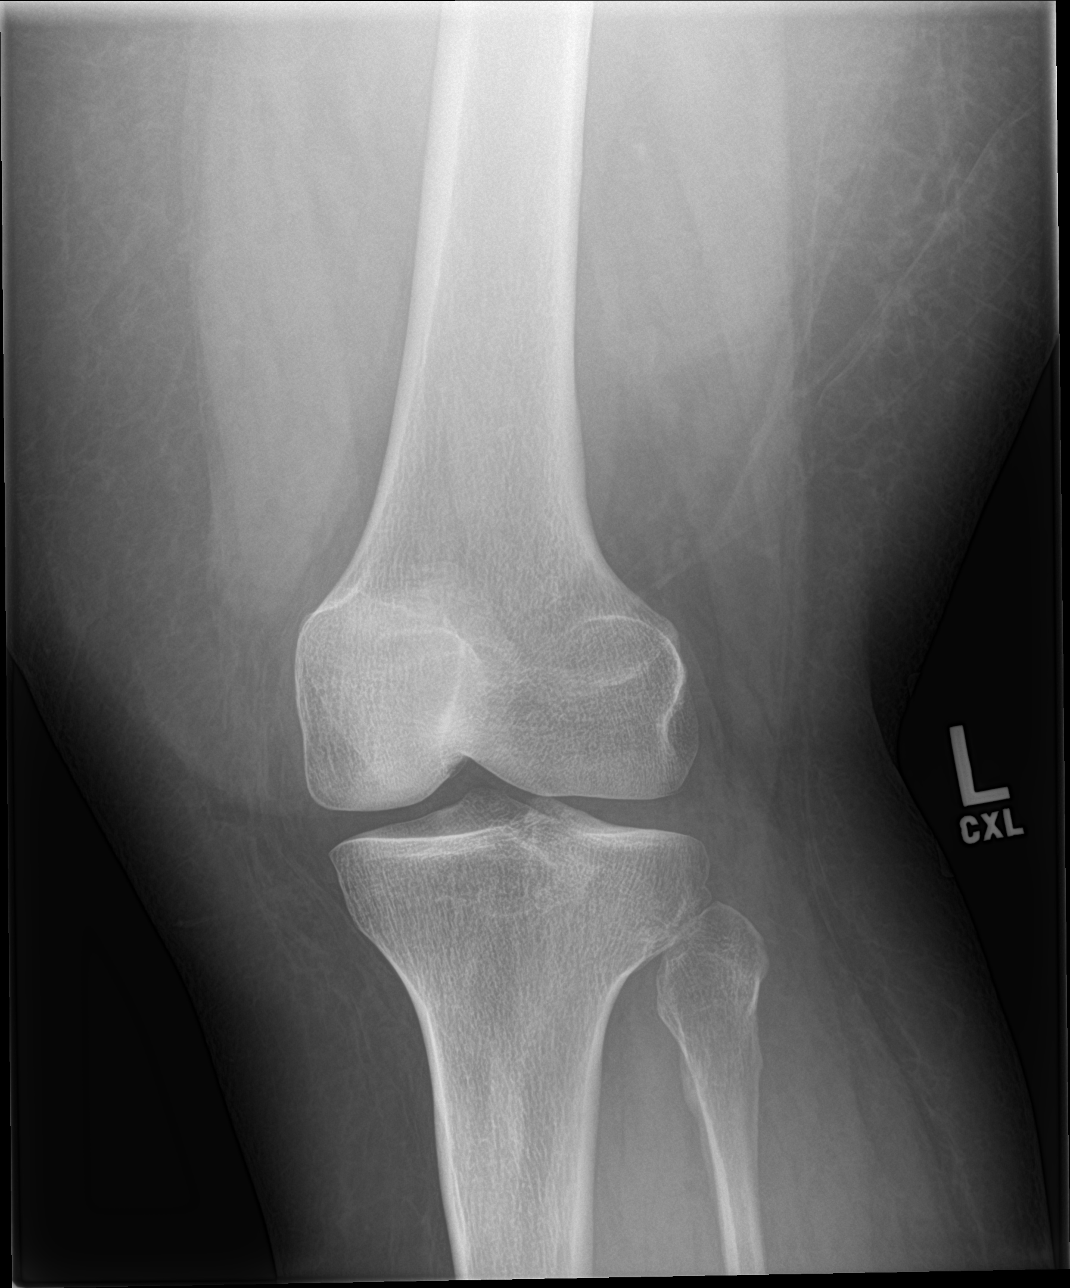

[knee obl (2 of 2)]
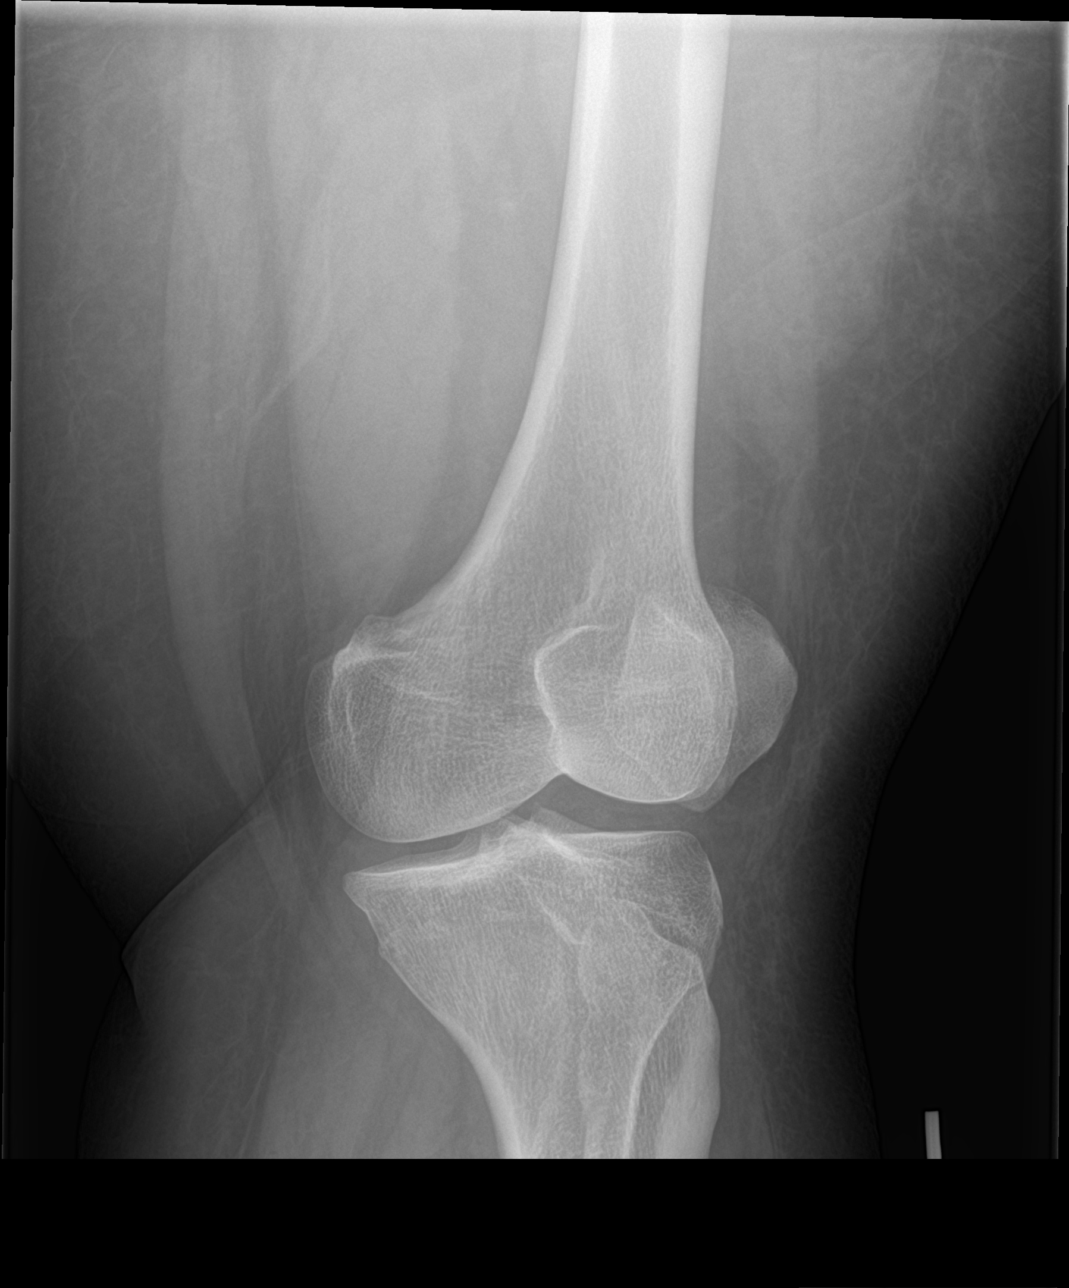

[4 of 4 positions shown; findings below may reference images not displayed]

FINDINGS: No fracture or dislocation is seen.

Mild degenerative changes with sharpening of the tibial spines.

Visualized soft tissues are within normal limits.

No suprapatellar knee joint effusion.
IMPRESSION: No fracture or dislocation is seen.

## 2018-12-23 ENCOUNTER — Other Ambulatory Visit: Payer: Self-pay

## 2018-12-23 ENCOUNTER — Ambulatory Visit: Payer: Self-pay | Admitting: Internal Medicine

## 2018-12-23 DIAGNOSIS — I10 Essential (primary) hypertension: Secondary | ICD-10-CM

## 2018-12-23 DIAGNOSIS — G4733 Obstructive sleep apnea (adult) (pediatric): Secondary | ICD-10-CM

## 2018-12-23 NOTE — Progress Notes (Signed)
Attempted to call for telephone encounter. No answer. No charge.

## 2019-07-07 ENCOUNTER — Other Ambulatory Visit: Payer: Self-pay

## 2019-07-07 ENCOUNTER — Encounter: Payer: Self-pay | Admitting: Internal Medicine

## 2019-07-07 ENCOUNTER — Ambulatory Visit (INDEPENDENT_AMBULATORY_CARE_PROVIDER_SITE_OTHER): Payer: BC Managed Care – PPO | Admitting: Internal Medicine

## 2019-07-07 VITALS — BP 140/98 | HR 80 | Temp 98.4°F | Wt 386.8 lb

## 2019-07-07 DIAGNOSIS — G4733 Obstructive sleep apnea (adult) (pediatric): Secondary | ICD-10-CM

## 2019-07-07 DIAGNOSIS — I10 Essential (primary) hypertension: Secondary | ICD-10-CM | POA: Diagnosis not present

## 2019-07-07 DIAGNOSIS — N3944 Nocturnal enuresis: Secondary | ICD-10-CM

## 2019-07-07 DIAGNOSIS — Z79899 Other long term (current) drug therapy: Secondary | ICD-10-CM

## 2019-07-07 DIAGNOSIS — Z6841 Body Mass Index (BMI) 40.0 and over, adult: Secondary | ICD-10-CM

## 2019-07-07 NOTE — Progress Notes (Signed)
   CC: OSA, HTN, Obesity, Urinary incontinence  HPI:  [name] is a 38 y.o. male with PMHx listed below presenting for OSA, HTN, Obesity, Urinary incontinence. Please see the A&P for the status of the patient's chronic medical problems.  Past Medical History:  Diagnosis Date  . GERD (gastroesophageal reflux disease)   . Obesity (BMI 30.0-34.9)   . Sleep apnea    not on CPAP   Review of Systems:  Performed and all others negative.  Physical Exam: Vitals:   07/07/19 0904  BP: (!) 140/98  Pulse: 80  Temp: 98.4 F (36.9 C)  TempSrc: Oral  SpO2: 99%  Weight: (!) 386 lb 12.8 oz (175.5 kg)   General: Morbidly obese male in no acute distress Pulm: Good air movement with no wheezing or crackles  CV: RRR, no murmurs, no rubs  GU/Rectal: Normal rectal tone, unable to palpate the prostate due to body habitus Neuro: Alert and oriented x 3. Cranial nerves II-XII intact, gross strength 5/5 in all extremities, no saddle anesthesia, sensation to light touch intact in all extremities, 2+ patellar reflexes.   Assessment & Plan:   See Encounters Tab for problem based charting.  Patient discussed with Dr. Daryll Drown c

## 2019-07-07 NOTE — Assessment & Plan Note (Signed)
HPI:  patient with morbid obesity. He is gained a significant amount of weight recently due to life stressors. He states that he is not living in a motel and does not need a balanced diet. He also does not exercise due to his urinary incontinence.  A/P: - Discussed dietary and lifestyle changes - He is a candidate for bariatric surgery given his hypertension but is not interested at this point - Once lifestyle modifications have been implemented he may be a candidate for medication use.

## 2019-07-07 NOTE — Patient Instructions (Signed)
Thank you for allowing me to provide your care. Today we are:  1) Referring you for a sleep study.   2) Checking some bloodwork  3) Referring you to urology for further evaluation.   Continue to work on weight loss as this would be the best thing for your health. I will call you with the result of your blood work once available. Please come back to see me in 3 months or sooner if any issues arise.

## 2019-07-07 NOTE — Progress Notes (Signed)
Internal Medicine Clinic Attending  Case discussed with Dr. Helberg  soon after the resident saw the patient.  We reviewed the resident's history and exam and pertinent patient test results.  I agree with the assessment, diagnosis, and plan of care documented in the resident's note.  

## 2019-07-07 NOTE — Assessment & Plan Note (Signed)
HPI:  Patient with uncontrolled hypertension. He is gained a significant amount of weight over the last couple years due to life stressors. He denies signs or symptoms of an organ damage related to his blood pressure. He is taking his amlodipine 5 mg daily as prescribed without any apparent side effects.  A/P: - Uncontrolled hypertension. Continue amlodipine 5 mg daily. Hopefully with weight loss in the treatment of his underlying OSA we will be able to avoid starting another medication. - Check BMP today

## 2019-07-07 NOTE — Assessment & Plan Note (Signed)
HPI:  patient has had recurrent issues of polyuria and urinary incontinence since his childhood. He has been evaluated by multiple urologist without definitive diagnosis. He states that he feels he avoids a small amount and sometimes does not feel he empties his bladder completely. 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 Since that time he has noticed his urinary incontinence has progressed and he is now having to wear diapers throughout the day. He denies any over-the-counter supplements and is not on any diuretic therapies. He denies a history of recurrent UTIs. He denies neuropathy symptoms. He denies any pain in his back.  On physical exam his narrow exam is grossly unremarkable. He had normal anal sphincter tone but I was unable to palpate his prostate due to body habitus. I attempted to bedside POCUS but was unable to visualize his bladder.  A/P: - Urinary incontinence. Suspect a combination of functional and overflow. Discussed lifestyle modifications including timed toileting and avoiding high amount of caffeine/fluid. - Will hold off on spinal imaging given normal neuro and rectal exam.  - Low suspension for sacral plexopathy but will check A1c  - Referral to urology

## 2019-07-07 NOTE — Assessment & Plan Note (Signed)
HPI:  Patient with known OSA. He previously had a CPAP machine that he did not use because he did not feel it provided any benefit. 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 He has noticed significant amount weight gain over the past 1 to 2 years. He would like to get another sleep study.  A/P: - High likelihood of OSA. Will get a split night sleep study.

## 2019-07-08 LAB — BMP8+ANION GAP
Anion Gap: 24 mmol/L — ABNORMAL HIGH (ref 10.0–18.0)
BUN/Creatinine Ratio: 13 (ref 9–20)
BUN: 12 mg/dL (ref 6–20)
CO2: 21 mmol/L (ref 20–29)
Calcium: 9.3 mg/dL (ref 8.7–10.2)
Chloride: 101 mmol/L (ref 96–106)
Creatinine, Ser: 0.94 mg/dL (ref 0.76–1.27)
GFR calc Af Amer: 118 mL/min/{1.73_m2} (ref >59)
GFR calc non Af Amer: 102 mL/min/{1.73_m2} (ref >59)
Glucose: 98 mg/dL (ref 65–99)
Potassium: 4.5 mmol/L (ref 3.5–5.2)
Sodium: 146 mmol/L — ABNORMAL HIGH (ref 134–144)

## 2019-07-08 LAB — HEMOGLOBIN A1C
Est. average glucose Bld gHb Est-mCnc: 120 mg/dL
Hgb A1c MFr Bld: 5.8 % — ABNORMAL HIGH (ref 4.8–5.6)

## 2019-07-22 ENCOUNTER — Telehealth: Payer: Self-pay | Admitting: Internal Medicine

## 2019-07-22 NOTE — Telephone Encounter (Signed)
Pt calling to get his lab results.  Pt states he has been waiting for 2 weeks and no one has called him b ack.  Please call patient back.

## 2019-07-22 NOTE — Telephone Encounter (Signed)
Result Notes for Hemoglobin A1c  Notes recorded by Ina Homes, MD on 07/08/2019 at 11:04 AM EDT  Attempted to call patient to discuss that his labs were normal with the exception of pre-diabetes. He is going to work on weight loss.     Call placed to number in chart. Male answered and stated it was a wrong number. Dr. Tarri Abernethy attempted to call patient on 07/08/2019 but was also unable to reach patient. Hubbard Hartshorn, BSN, RN-BC

## 2019-07-25 NOTE — Telephone Encounter (Signed)
Patient notified of normal lab results with exception of pre-diabetes. He stated he would work on weight loss. He was very Patent attorney. Hubbard Hartshorn, BSN, RN-BC

## 2019-07-25 NOTE — Telephone Encounter (Signed)
Patient number is incorrect in the chart.  The Correct number should be [phone].  Spoke with the pt an verified and corrected his information.  Please call the patient back.

## 2019-07-29 ENCOUNTER — Other Ambulatory Visit (HOSPITAL_COMMUNITY)
Admission: RE | Admit: 2019-07-29 | Discharge: 2019-07-29 | Disposition: A | Discharge status: Home / Self Care | Payer: BC Managed Care – PPO | Source: Ambulatory Visit | Attending: Internal Medicine | Admitting: Internal Medicine

## 2019-07-29 DIAGNOSIS — Z01812 Encounter for preprocedural laboratory examination: Secondary | ICD-10-CM | POA: Insufficient documentation

## 2019-07-29 DIAGNOSIS — Z20828 Contact with and (suspected) exposure to other viral communicable diseases: Secondary | ICD-10-CM | POA: Diagnosis not present

## 2019-07-29 LAB — SARS CORONAVIRUS 2 (TAT 6-24 HRS): SARS Coronavirus 2: NEGATIVE

## 2019-07-31 ENCOUNTER — Other Ambulatory Visit: Payer: Self-pay

## 2019-07-31 ENCOUNTER — Ambulatory Visit (HOSPITAL_BASED_OUTPATIENT_CLINIC_OR_DEPARTMENT_OTHER): Payer: BC Managed Care – PPO | Attending: Internal Medicine | Admitting: Internal Medicine

## 2019-07-31 VITALS — Ht 64.0 in | Wt 386.0 lb

## 2019-07-31 DIAGNOSIS — G4733 Obstructive sleep apnea (adult) (pediatric): Secondary | ICD-10-CM

## 2019-08-01 ENCOUNTER — Other Ambulatory Visit (HOSPITAL_BASED_OUTPATIENT_CLINIC_OR_DEPARTMENT_OTHER): Payer: Self-pay

## 2019-08-01 DIAGNOSIS — G4733 Obstructive sleep apnea (adult) (pediatric): Secondary | ICD-10-CM

## 2019-08-06 DIAGNOSIS — G4733 Obstructive sleep apnea (adult) (pediatric): Secondary | ICD-10-CM

## 2019-08-06 NOTE — Procedures (Signed)
Patient Name: Steve Hurst, Terrance Date: 07/31/2019 Gender: Male D.O.B: 06-01-1981 Age (years): 38 Referring Provider: Gilles Chiquito Height (inches): 33 Interpreting Physician: Baird Lyons MD, ABSM Weight (lbs): 386 RPSGT: Gwenyth Allegra BMI: 66 MRN: [id number] Neck Size: 17.00  CLINICAL INFORMATION Sleep Study Type: Split Night CPAP Indication for sleep study: OSA Epworth Sleepiness Score: 2  SLEEP STUDY TECHNIQUE As per the AASM Manual for the Scoring of Sleep and Associated Events v2.3 (April 2016) with a hypopnea requiring 4% desaturations.  The channels recorded and monitored were frontal, central and occipital EEG, electrooculogram (EOG), submentalis EMG (chin), nasal and oral airflow, thoracic and abdominal wall motion, anterior tibialis EMG, snore microphone, electrocardiogram, and pulse oximetry. Continuous positive airway pressure (CPAP) was initiated when the patient met split night criteria and was titrated according to treat sleep-disordered breathing.  MEDICATIONS Medications self-administered by patient taken the night of the study : none reported  RESPIRATORY PARAMETERS Diagnostic  Total AHI (/hr): 67.0 RDI (/hr): 69.4 OA Index (/hr): 38.6 CA Index (/hr): 0.0 REM AHI (/hr): 100.0 NREM AHI (/hr): 64.4 Supine AHI (/hr): 99.3 Non-supine AHI (/hr): 51.4 Min O2 Sat (%): 81.0 Mean O2 (%): 93.6 Time below 88% (min): 3.8   Titration  Optimal Pressure (cm): 15 AHI at Optimal Pressure (/hr): 0.0 Min O2 at Optimal Pressure (%): 95.0 Supine % at Optimal (%): 0 Sleep % at Optimal (%): 90   SLEEP ARCHITECTURE The recording time for the entire night was 387.1 minutes.  During a baseline period of 146.1 minutes, the patient slept for 124.5 minutes in REM and nonREM, yielding a sleep efficiency of 85.2%%. Sleep onset after lights out was 17.4 minutes with a REM latency of 104.5 minutes. The patient spent 6.4%% of the night in stage N1 sleep, 86.3%% in stage N2 sleep,  0.0%% in stage N3 and 7.2% in REM.  During the titration period of 239.0 minutes, the patient slept for 220.0 minutes in REM and nonREM, yielding a sleep efficiency of 92.0%%. Sleep onset after CPAP initiation was 10.4 minutes with a REM latency of 64.0 minutes. The patient spent 3.9%% of the night in stage N1 sleep, 65.5%% in stage N2 sleep, 0.0%% in stage N3 and 30.7% in REM.  CARDIAC DATA The 2 lead EKG demonstrated sinus rhythm. The mean heart rate was 100.0 beats per minute. Other EKG findings include: None. LEG MOVEMENT DATA The total Periodic Limb Movements of Sleep (PLMS) were 0. The PLMS index was 0.0 .  IMPRESSIONS - Severe obstructive sleep apnea occurred during the diagnostic portion of the study (AHI = 67.0/hour). An optimal PAP pressure was selected for this patient ( 15 cm of water) - No significant central sleep apnea occurred during the diagnostic portion of the study (CAI = 0.0/hour). - Severe oxygen desaturation was noted during the diagnostic portion of the study (Min O2 = 81.0%). Min sat at CPAP 15 was 95%. - The patient snored with loud snoring volume during the diagnostic portion of the study. - No cardiac abnormalities were noted during this study. - Clinically significant periodic limb movements did not occur during sleep.  DIAGNOSIS - Obstructive Sleep Apnea (327.23 [G47.33 ICD-10])  RECOMMENDATIONS - Trial of CPAP therapy on 15 cm H2O or autopap 10-20. - Patient used a Medium size Fisher[employer]Paykel Full Face Mask Simplus mask and heated humidification. - Be careful with alcohol, sedatives and other CNS depressants that may worsen sleep apnea and disrupt normal sleep architecture. - Sleep hygiene should be reviewed to assess factors that  may improve sleep quality. - Weight management and regular exercise should be initiated or continued.  [Electronically signed] 08/06/2019 11:47 AM  Baird Lyons MD, ABSM Diplomate, American Board of Sleep Medicine   NPI:  [id number]                          [place]  ELECTRONICALLY SIGNED ON:  08/06/2019, 11:48 AM [place] PH: [phone]   FX: [phone] [place]

## 2019-08-09 ENCOUNTER — Telehealth: Payer: Self-pay | Admitting: Internal Medicine

## 2019-08-09 DIAGNOSIS — G4733 Obstructive sleep apnea (adult) (pediatric): Secondary | ICD-10-CM

## 2019-08-09 NOTE — Telephone Encounter (Signed)
Patient with recent sleep study illustrating severe OSA. Recommendations are for CPAP trial at 15 cm H2O or autopap 10-20 cm H2O with medium size Fisher [employer] Paykel Full Face Mask Simplus mask and heated humidification. Will send out CPAP order.   Thank you

## 2019-08-11 NOTE — Telephone Encounter (Signed)
Community Message sent to [name] for review.Steve Hurst, [name]11/12/20209:29 AM

## 2019-09-06 DIAGNOSIS — G4733 Obstructive sleep apnea (adult) (pediatric): Secondary | ICD-10-CM | POA: Diagnosis not present

## 2019-09-15 NOTE — Telephone Encounter (Signed)
Completed/signed CMN for CPAP and supplies faxed to [facility] at [phone]. Hubbard Hartshorn, BSN, RN-BC

## 2019-09-26 DIAGNOSIS — R351 Nocturia: Secondary | ICD-10-CM | POA: Diagnosis not present

## 2019-09-26 DIAGNOSIS — R3915 Urgency of urination: Secondary | ICD-10-CM | POA: Diagnosis not present

## 2019-10-06 ENCOUNTER — Encounter: Payer: BC Managed Care – PPO | Admitting: Internal Medicine

## 2019-10-06 ENCOUNTER — Encounter: Payer: Self-pay | Admitting: Internal Medicine

## 2019-10-06 ENCOUNTER — Ambulatory Visit (INDEPENDENT_AMBULATORY_CARE_PROVIDER_SITE_OTHER): Payer: Self-pay | Admitting: Internal Medicine

## 2019-10-06 VITALS — BP 150/83 | HR 78 | Temp 98.4°F | Ht 64.0 in | Wt 378.9 lb

## 2019-10-06 DIAGNOSIS — K5900 Constipation, unspecified: Secondary | ICD-10-CM

## 2019-10-06 DIAGNOSIS — G4733 Obstructive sleep apnea (adult) (pediatric): Secondary | ICD-10-CM

## 2019-10-06 DIAGNOSIS — M25562 Pain in left knee: Secondary | ICD-10-CM

## 2019-10-06 DIAGNOSIS — Z79899 Other long term (current) drug therapy: Secondary | ICD-10-CM

## 2019-10-06 DIAGNOSIS — Z9911 Dependence on respirator [ventilator] status: Secondary | ICD-10-CM

## 2019-10-06 DIAGNOSIS — N3944 Nocturnal enuresis: Secondary | ICD-10-CM

## 2019-10-06 DIAGNOSIS — R358 Other polyuria: Secondary | ICD-10-CM

## 2019-10-06 DIAGNOSIS — I10 Essential (primary) hypertension: Secondary | ICD-10-CM

## 2019-10-06 DIAGNOSIS — Z6841 Body Mass Index (BMI) 40.0 and over, adult: Secondary | ICD-10-CM

## 2019-10-06 MED ORDER — AMLODIPINE BESYLATE 5 MG PO TABS: 5.0000 mg | ORAL_TABLET | Freq: Every day | ORAL | 2 refills | Status: DC

## 2019-10-06 MED ORDER — LISINOPRIL 20 MG PO TABS: 20.0000 mg | ORAL_TABLET | Freq: Every day | ORAL | 0 refills | Status: AC

## 2019-10-06 MED ORDER — LISINOPRIL 20 MG PO TABS: 20.0000 mg | ORAL_TABLET | Freq: Every day | ORAL | 0 refills | Status: DC

## 2019-10-06 MED ORDER — DICLOFENAC SODIUM 1 % EX GEL: 2.0000 g | Freq: Three times a day (TID) | CUTANEOUS | 0 refills | Status: AC | PRN

## 2019-10-06 MED ORDER — AMLODIPINE BESYLATE 5 MG PO TABS: 5.0000 mg | ORAL_TABLET | Freq: Every day | ORAL | 2 refills | Status: AC

## 2019-10-06 MED ORDER — DICLOFENAC SODIUM 1 % EX GEL: 2.0000 g | Freq: Three times a day (TID) | CUTANEOUS | 0 refills | Status: DC | PRN

## 2019-10-06 NOTE — Assessment & Plan Note (Addendum)
Has established with urology. Started on oxybutinin. Feels this has helped his symptoms but he continues to to have some incontinence. He has also started behavior modification with timed toileting.   A/P: - Improving  - Discussed the benefits of weight loss

## 2019-10-06 NOTE — Patient Instructions (Signed)
Thanks for allowing me to provide your care. Today we started a new medication for your blood please take this as prescribed. I would like you to come back in four weeks for blood pressure check and some further labs. Continue to work on weight loss. This will be the most beneficial thing for your help.

## 2019-10-06 NOTE — Assessment & Plan Note (Addendum)
Established with Dr. Maple Hudson since our last visits. He is tolerating the CPAP well. He is trying to use it for at least 8 hours a night. He feels his sleep has improved.   A/P: - Stable  - Continue CPAP

## 2019-10-06 NOTE — Progress Notes (Signed)
   CC: HTN, OSA, Left knee pain, Polyuria  HPI:  Steve Hurst is a 39 y.o. male with PMHx listed below presenting for HTN, OSA, Left knee pain, Polyuria. Please see the A&P for the status of the patient's chronic medical problems.  Past Medical History:  Diagnosis Date  . GERD (gastroesophageal reflux disease)   . Obesity (BMI 30.0-34.9)   . Sleep apnea    not on CPAP   Review of Systems: Performed and all others negative.  Physical Exam: Vitals:   10/06/19 1530  BP: (!) 168/120  Pulse: 88  Temp: 98.4 F (36.9 C)  TempSrc: Oral  SpO2: 98%  Weight: (!) 378 lb 14.4 oz (171.9 kg)  Height: 5\' 4"  (1.626 m)   General: morbidly obese male in no acute distress Pulm: Good air movement with no wheezing or crackles  CV: RRR, no murmurs, no rubs   Extremities: Pulses palpable in all extremities, no LE edema   Assessment & Plan:   See Encounters Tab for problem based charting.  Patient discussed with Dr. 

## 2019-10-06 NOTE — Assessment & Plan Note (Signed)
Uncontrolled HTN. He continues to gain weight. Has been taking his amlodipine 5 mg QD at prescribed. No orthostatic symptoms. Does struggle with intermittent constipation and urinary issues. I reviewed his last BMP.   A/P: - Continue amlodipine 5 mg QD  - Avoid diuretics with urinary issues  - Start lisinopril 20 mg QD - Repeat BMP and BP check in 4weeks.

## 2019-10-07 NOTE — Progress Notes (Signed)
Internal Medicine Clinic Attending  Case discussed with Dr. Helberg at the time of the visit.  We reviewed the resident's history and exam and pertinent patient test results.  I agree with the assessment, diagnosis, and plan of care documented in the resident's note.    

## 2019-10-07 NOTE — Assessment & Plan Note (Signed)
Patient with morbid obesity and associated comorbidies. He is currently trying to modify his diet to assist in weight loss. He does not exercise. 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 He is not interested in surgical intervention. He does not have insurance and does not think he could afford medications.   A/P: - Discussed that his comorbidies are driven by his obesity  - Discussed lifestyle modification, available medications, and surgical intervention  - Once insurance kicks-in we will consider starting medications to assist in weight loss

## 2019-10-07 NOTE — Assessment & Plan Note (Signed)
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 Pain is intermittent but does occasional affect his sleep. He takes OTC pain medications PRN. On physical exam he has not tenderness to palpation and no pain is elicited with active or passive range of motion. No increase laxity. No pain elicited with medial or lateral stress.   A/P: - Trial of PRN Voltaren gel

## 2019-12-22 ENCOUNTER — Ambulatory Visit: Payer: Managed Care, Other (non HMO) | Attending: Internal Medicine

## 2019-12-22 DIAGNOSIS — Z23 Encounter for immunization: Secondary | ICD-10-CM

## 2019-12-22 NOTE — Progress Notes (Signed)
   Covid-19 Vaccination Clinic  Name:  Steve Hurst    MRN: [id number] DOB: 1981/01/05  12/22/2019  Steve Hurst was observed post Covid-19 immunization for 15 minutes without incident. He was provided with Vaccine Information Sheet and instruction to access the V-Safe system.   Steve Hurst was instructed to call 911 with any severe reactions post vaccine: Marland Kitchen Difficulty breathing  . Swelling of face and throat  . A fast heartbeat  . A bad rash all over body  . Dizziness and weakness   Immunizations Administered    Name Date Dose VIS Date Route   Pfizer COVID-19 Vaccine 12/22/2019 10:19 AM 0.3 mL 09/09/2019 Intramuscular   Manufacturer: [employer], [employer]   Lot: [id number]   NDC: [address]-8

## 2020-01-16 ENCOUNTER — Ambulatory Visit: Payer: No Typology Code available for payment source | Attending: Internal Medicine

## 2020-01-16 DIAGNOSIS — Z23 Encounter for immunization: Secondary | ICD-10-CM

## 2020-01-16 NOTE — Progress Notes (Signed)
   Covid-19 Vaccination Clinic  Name:  Steve Hurst    MRN: [id number] DOB: 15-Feb-1981  01/16/2020  [name] was observed post Covid-19 immunization for 15 minutes without incident. He was provided with Vaccine Information Sheet and instruction to access the V-Safe system.   [name] was instructed to call 911 with any severe reactions post vaccine: Marland Kitchen Difficulty breathing  . Swelling of face and throat  . A fast heartbeat  . A bad rash all over body  . Dizziness and weakness   Immunizations Administered    Name Date Dose VIS Date Route   Pfizer COVID-19 Vaccine 01/16/2020 11:16 AM 0.3 mL 11/23/2018 Intramuscular   Manufacturer: [employer], [employer]   Lot: [id number]   NDC: [address]-4

## 2020-02-02 ENCOUNTER — Encounter: Payer: Self-pay | Admitting: *Deleted

## 2021-07-23 ENCOUNTER — Emergency Department (HOSPITAL_COMMUNITY)
Admission: EM | Admit: 2021-07-23 | Discharge: 2021-07-24 | Disposition: A | Discharge status: Home / Self Care | Payer: Self-pay | Attending: Emergency Medicine | Admitting: Emergency Medicine

## 2021-07-23 DIAGNOSIS — Z20822 Contact with and (suspected) exposure to covid-19: Secondary | ICD-10-CM | POA: Insufficient documentation

## 2021-07-23 DIAGNOSIS — R062 Wheezing: Secondary | ICD-10-CM | POA: Insufficient documentation

## 2021-07-23 DIAGNOSIS — R059 Cough, unspecified: Secondary | ICD-10-CM | POA: Insufficient documentation

## 2021-07-23 DIAGNOSIS — Z5321 Procedure and treatment not carried out due to patient leaving prior to being seen by health care provider: Secondary | ICD-10-CM | POA: Insufficient documentation

## 2021-07-23 DIAGNOSIS — R0789 Other chest pain: Secondary | ICD-10-CM | POA: Insufficient documentation

## 2021-07-23 DIAGNOSIS — J029 Acute pharyngitis, unspecified: Secondary | ICD-10-CM | POA: Insufficient documentation

## 2021-07-24 ENCOUNTER — Emergency Department (HOSPITAL_COMMUNITY): Payer: Self-pay

## 2021-07-24 ENCOUNTER — Other Ambulatory Visit: Payer: Self-pay

## 2021-07-24 LAB — RESP PANEL BY RT-PCR (FLU A&B, COVID) ARPGX2
Influenza A by PCR: NEGATIVE
Influenza B by PCR: NEGATIVE
SARS Coronavirus 2 by RT PCR: NEGATIVE

## 2021-07-24 NOTE — ED Triage Notes (Signed)
Pt c/o cough, chest aches, sore throat, and SOB x1 day. No Known sick exposure

## 2021-07-24 NOTE — ED Provider Notes (Signed)
Emergency Medicine Provider Triage Evaluation Note  Steve Hurst , a 40 y.o. male  was evaluated in triage.  Pt complains of cough and wheezing onset today. Some associated central chest pain. No medications taken PTA for symptoms. Thinks he may have become ill from a coworker.  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  He has not had any leg swelling, hemoptysis, syncope, fevers.  Review of Systems  Positive: Cough, chest pain, wheezing Negative: Fevers, hemoptysis, syncope  Physical Exam  BP (!) 165/106 (BP Location: Right Arm)   Pulse 85   Temp 98.8 F (37.1 C) (Oral)   Resp 18   Ht 5\' 4"  (1.626 m)   Wt (!) 166.5 kg   SpO2 98%   BMI 63.00 kg/m  Gen:   Awake, no distress   Resp:  Normal effort MSK:   Moves extremities without difficulty  Other:  Lungs grossly CTAB. Obese male.  Medical Decision Making  Medically screening exam initiated at 1:12 AM.  Appropriate orders placed.  Steve Hurst was informed that the remainder of the evaluation will be completed by another provider, this initial triage assessment does not replace that evaluation, and the importance of remaining in the ED until their evaluation is complete.  Cough, nonspecific chest pain.   , PA-C 07/24/21 0113    07/26/21, MD 07/24/21 [phone]

## 2021-07-24 NOTE — ED Notes (Signed)
Pt states d/t wait time he is leaving
